# Patient Record
Sex: Female | Born: 1960 | Race: White | Hispanic: No | Marital: Married | Smoking: Never smoker
Health system: Southern US, Community
[De-identification: ages and names within clinical notes are randomized; demographics above are authoritative.]

## PROBLEM LIST (undated history)

## (undated) DIAGNOSIS — R112 Nausea with vomiting, unspecified: Secondary | ICD-10-CM

## (undated) DIAGNOSIS — G4730 Sleep apnea, unspecified: Secondary | ICD-10-CM

## (undated) DIAGNOSIS — I10 Essential (primary) hypertension: Secondary | ICD-10-CM

## (undated) DIAGNOSIS — T8859XA Other complications of anesthesia, initial encounter: Secondary | ICD-10-CM

## (undated) DIAGNOSIS — Z9889 Other specified postprocedural states: Secondary | ICD-10-CM

## (undated) DIAGNOSIS — M1990 Unspecified osteoarthritis, unspecified site: Secondary | ICD-10-CM

## (undated) DIAGNOSIS — L900 Lichen sclerosus et atrophicus: Secondary | ICD-10-CM

## (undated) DIAGNOSIS — N809 Endometriosis, unspecified: Secondary | ICD-10-CM

## (undated) DIAGNOSIS — K219 Gastro-esophageal reflux disease without esophagitis: Secondary | ICD-10-CM

## (undated) HISTORY — PX: BREAST SURGERY: SHX581

## (undated) HISTORY — PX: DILATION AND CURETTAGE OF UTERUS: SHX78

## (undated) HISTORY — PX: CHOLECYSTECTOMY: SHX55

## (undated) HISTORY — PX: RHINOPLASTY: SUR1284

## (undated) HISTORY — DX: Endometriosis, unspecified: N80.9

## (undated) HISTORY — PX: LAPAROSCOPY: SHX197

## (undated) HISTORY — DX: Unspecified osteoarthritis, unspecified site: M19.90

## (undated) HISTORY — DX: Lichen sclerosus et atrophicus: L90.0

## (undated) HISTORY — PX: HYSTEROSCOPY: SHX211

---

## 2003-05-08 ENCOUNTER — Other Ambulatory Visit: Admission: RE | Admit: 2003-05-08 | Discharge: 2003-05-08 | Payer: Self-pay | Admitting: Obstetrics and Gynecology

## 2004-05-08 ENCOUNTER — Other Ambulatory Visit: Admission: RE | Admit: 2004-05-08 | Discharge: 2004-05-08 | Payer: Self-pay | Admitting: Obstetrics and Gynecology

## 2006-01-01 ENCOUNTER — Other Ambulatory Visit: Admission: RE | Admit: 2006-01-01 | Discharge: 2006-01-01 | Payer: Self-pay | Admitting: Obstetrics & Gynecology

## 2007-03-17 ENCOUNTER — Other Ambulatory Visit: Admission: RE | Admit: 2007-03-17 | Discharge: 2007-03-17 | Payer: Self-pay | Admitting: Obstetrics and Gynecology

## 2008-07-28 HISTORY — PX: REDUCTION MAMMAPLASTY: SUR839

## 2008-09-20 ENCOUNTER — Other Ambulatory Visit: Admission: RE | Admit: 2008-09-20 | Discharge: 2008-09-20 | Payer: Self-pay | Admitting: Obstetrics and Gynecology

## 2008-10-03 ENCOUNTER — Ambulatory Visit (HOSPITAL_BASED_OUTPATIENT_CLINIC_OR_DEPARTMENT_OTHER): Admission: RE | Admit: 2008-10-03 | Discharge: 2008-10-03 | Payer: Self-pay | Admitting: Obstetrics and Gynecology

## 2008-10-03 ENCOUNTER — Encounter: Payer: Self-pay | Admitting: Obstetrics and Gynecology

## 2009-03-26 HISTORY — PX: NOVASURE ABLATION: SHX5394

## 2010-07-05 ENCOUNTER — Ambulatory Visit (HOSPITAL_COMMUNITY)
Admission: RE | Admit: 2010-07-05 | Discharge: 2010-07-05 | Payer: Self-pay | Source: Home / Self Care | Attending: Gastroenterology | Admitting: Gastroenterology

## 2010-07-05 ENCOUNTER — Inpatient Hospital Stay (HOSPITAL_COMMUNITY)
Admission: EM | Admit: 2010-07-05 | Discharge: 2010-07-07 | Payer: Self-pay | Source: Home / Self Care | Attending: Surgery | Admitting: Surgery

## 2010-07-05 LAB — PROTIME-INR: INR: 1 (ref 0.00–1.49)

## 2010-07-05 LAB — DIFFERENTIAL
Basophils Relative: 0 % (ref 0–1)
Lymphs Abs: 1.1 10*3/uL (ref 0.7–4.0)
Monocytes Absolute: 0.2 10*3/uL (ref 0.1–1.0)
Monocytes Relative: 3 % (ref 3–12)
Neutro Abs: 6.4 10*3/uL (ref 1.7–7.7)
Neutrophils Relative %: 83 % — ABNORMAL HIGH (ref 43–77)

## 2010-07-05 LAB — BASIC METABOLIC PANEL
BUN: 8 mg/dL (ref 6–23)
CO2: 24 mEq/L (ref 19–32)
Calcium: 9.3 mg/dL (ref 8.4–10.5)
Chloride: 107 mEq/L (ref 96–112)
Creatinine, Ser: 0.78 mg/dL (ref 0.4–1.2)
Glucose, Bld: 116 mg/dL — ABNORMAL HIGH (ref 70–99)

## 2010-07-05 LAB — CBC
Hemoglobin: 14.1 g/dL (ref 12.0–15.0)
MCH: 29.7 pg (ref 26.0–34.0)
MCHC: 35.4 g/dL (ref 30.0–36.0)
MCV: 83.8 fL (ref 78.0–100.0)
RBC: 4.75 MIL/uL (ref 3.87–5.11)

## 2010-07-18 NOTE — Op Note (Signed)
[name], [name]               ACCOUNT NO.:  [id number]  MEDICAL RECORD NO.:  [id number]          PATIENT TYPE:  INP  LOCATION:  1305                         FACILITY:  [facility]  PHYSICIAN:  Thornton Park. Daphine Deutscher, MD  DATE OF BIRTH:  June 13, 1960  DATE OF PROCEDURE:  07/06/2010 DATE OF DISCHARGE:                              OPERATIVE REPORT   PREOPERATIVE DIAGNOSIS:  Acute on chronic cholecystitis and umbilical hernia from prior laparoscopy.  POSTOPERATIVE DIAGNOSIS:  Acute on chronic cholecystitis and umbilical hernia from prior laparoscopy with white bile.  PROCEDURE:  Laparoscopic cholecystectomy with intraoperative cholangiogram and closure of the umbilical hernia.  SURGEON:  Thornton Park. Daphine Deutscher, MD  ASSISTANT:  Sandria Bales. Ezzard Standing, MD  ANESTHESIA:  General endotracheal.  DESCRIPTION OF PROCEDURE:  This 50 year old white female from [place] was taken to OR 11 on Saturday, July 06, 2010, given general anesthesia.  The abdomen was prepped with PCMX and draped sterilely. The patient had a palpable umbilical hernia which we talked about before and that was not use as my means of entrance.  I excised her old transverse scar in the umbilical region and went down and dissected out the fatty hernia.  I went through that, entered the abdomen.  Hasson was placed and 3 trocars were placed in the upper abdomen.  Gallbladder was stuck to the surrounding tissue.  It had whitish hue with a lot of chronic inflammatory changes.  It was elevated and found to be turgid and very distended.  I drained it through the fundus and white bile came out.  I then grasped and elevated and stripped away the adhesions.  I went down and there was a stone impacted in the infundibulum and in that area, I spent time and dissected out Calot's triangle.  Clips were placed upon the artery which went up on to the gallbladder.  I dissected free the cystic duct.  A put a clip upon the gallbladder and incised it. I  did a dynamic cholangiogram which showed the duct to be patent. Intrahepatic radicals were noted and free flow into the duodenum was seen.  Cystic duct was triple clipped and divided.  The gallbladder was then removed from the gallbladder bed with a hook electrocautery without entering it.  Hemostasis was achieved.  No blood and no bile leaks were noted.  The gallbladder was removed and placed in a bag and brought out through the umbilicus.  We then went and looked back at the gallbladder and no bleeding or bile leaks were noted.  The umbilical defect was then visualized from above using again the 30-degree scope which we had used throughout the case. Using Endoclose, three #1 Novafil were placed to obliterate this 1.5-cm defect.  All port sites were injected with 0.5% Marcaine and closed with 4-0 Vicryl.  Benzoin Steri-Strips were used in the skin.  The patient tolerated procedure well and was taken to recovery room in satisfactory condition.     Thornton Park Daphine Deutscher, MD     MBM/MEDQ  D:  07/06/2010  T:  07/06/2010  Job:  [id number]  cc:   Dr. Francia Greaves [facility]  Anselmo Rod, MD, [facility] Fax: [phone]  Electronically Signed by Luretha Murphy MD on 07/18/2010 06:58:06 AM

## 2010-07-18 NOTE — H&P (Signed)
[name], [name]               ACCOUNT NO.:  [id number]  MEDICAL RECORD NO.:  [id number]           PATIENT TYPE:  LOCATION:                                 FACILITY:  PHYSICIAN:  Stephanie Park. Daphine Deutscher, MD  DATE OF BIRTH:  1960/08/19  DATE OF ADMISSION: DATE OF DISCHARGE:                             HISTORY & PHYSICAL   ADMITTING CHIEF COMPLAINT:  Midepigastric pain and gallstone lodged in her cystic duct.  HISTORY:  This is a 50 year old white female who was worked up by Dr. Loreta Ave earlier in the day, presenting with epigastric pain and some reflux.  She has arthritis and takes ibuprofen every other day. Recently, she has had a reasonably good appetite, her weight been stable, but she is just having pain without really nausea or vomiting.  She was sent over and had a gallbladder ultrasound exam, which showed gallstone lodged in the neck of the gallbladder and HIDA scan, which was positive for cystic duct occlusion. MEDICATIONS: 1. Wellbutrin, have one-half pill daily. 2. Ibuprofen for arthritis.  MEDICAL PROBLEMS: 1. Acid reflux. 2. Depression. 3. Joint pains. 4. History of miscarriage. 5. Arthritis.  PAST SURGICAL HISTORY:  C-section, tubal ligation, breast reduction.  FAMILY HISTORY:  Positive.  Her father is alive with hypertension. Mother is alive with bladder cancer.  Brother is alive with hypertension.  There is no family history of colon, breast, or ovarian cancer.  She is not a smoker and she does not drink.  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  She has 3 children, is married, and is accompanied by her husband tonight.  ALLERGIES:  Sulfa drugs that she had when she was a teenager, she had some acne drugs that had contained sulfa.  REVIEW OF SYSTEMS:  15-point review of systems is negative except for acid reflux, occasional rectal bleeding that may be related to the tears, no easy bruisability or bleeding disorders, no chest pain or cardiac history.  No  asthma or breathing difficulties, although she thinks sometimes she is a little short of breath when she exercises because of her weight.  She has had some problems with depression, treated with Wellbutrin.  Otherwise, review of systems is negative.  PHYSICAL EXAMINATION:  GENERAL:  Pleasant white female examined in her room in no acute distress. VITAL SIGNS:  BMI 36.  Temperature afebrile.  Heart rate is 70.  BP 120/80. HEENT:  Unremarkable. NECK:  Supple.  No JVD or lymphadenopathy. CARDIOVASCULAR:  Sinus rhythm without murmurs or gallops. CHEST:  Clear. BREASTS:  She has had breast reduction surgery. ABDOMEN:  Soft without rebound or guarding.  She has a small umbilical type hernia from her previous tubal ligation in the umbilicus. EXTREMITIES:  No cyanosis, edema, or clubbing.  IMPRESSION:  Acute-on-chronic cholecystitis with some residual pain.  PLAN:  Laparoscopic cholecystectomy, probably with intraoperative cholangiogram tomorrow.  I have discussed this procedure with her and her husband and I describedit and its complications, not limited to bile leaks, common bile duct injury, bowel injury, etc.  She is aware of this.  She wants to go ahead and proceed and we will go  ahead, make the necessary arrangements.     Stephanie Park Daphine Deutscher, MD     MBM/MEDQ  D:  07/05/2010  T:  07/05/2010  Job:  [id number]  cc:   Anselmo Rod, MD, [facility] Fax: [phone]  Electronically Signed by Luretha Murphy MD on 07/18/2010 06:58:08 AM

## 2010-09-18 LAB — CBC
HCT: 35.2 % — ABNORMAL LOW (ref 36.0–46.0)
Hemoglobin: 11.6 g/dL — ABNORMAL LOW (ref 12.0–15.0)
MCHC: 33 g/dL (ref 30.0–36.0)
MCV: 77.2 fL — ABNORMAL LOW (ref 78.0–100.0)
RBC: 4.56 MIL/uL (ref 3.87–5.11)
WBC: 4.6 10*3/uL (ref 4.0–10.5)

## 2010-09-18 LAB — HCG, SERUM, QUALITATIVE: Preg, Serum: NEGATIVE

## 2010-10-22 NOTE — Op Note (Signed)
[name], [name]               ACCOUNT NO.:  [id number]   MEDICAL RECORD NO.:  [id number]          PATIENT TYPE:  AMB   LOCATION:  [facility]                         FACILITY:  [facility]   PHYSICIAN:  Cynthia P. Romine, M.D.DATE OF BIRTH:  June 17, 1960   DATE OF PROCEDURE:  10/03/2008  DATE OF DISCHARGE:                               OPERATIVE REPORT   PREOPERATIVE DIAGNOSES:  Menorrhagia, known endometrial polyps seen on  office sonohysterogram.   POSTOPERATIVE DIAGNOSIS:  Menorrhagia, known endometrial polyps seen on  office sonohysterogram.  Path pending.   PROCEDURE:  Hysteroscopic resection of polyps, dilatation and curettage.   ANESTHESIA:  General by LMA.   ESTIMATED BLOOD LOSS:  Minimal.   Sorbitol deficit zero.   COMPLICATIONS:  None.   PROCEDURE:  The patient was taken to the operating room and after  induction of adequate general anesthesia by LMA was placed in dorsal  lithotomy position and prepped and draped in the usual fashion.  The  bladder was drained with a red rubber catheter.  Anterior weighted and  posterior Sims retractors were placed and  the cervix was grasped on its  anterior lip with a single-tooth tenaculum.  Paracervical block was  instituted by injecting 10 mL of 1% lidocaine at each of 3 and 9  o'clock.  The uterus sounded to 12 cm.  The cervix was dilated to #31  Shawnie Pons.  The operative hysteroscope was introduced.  Sorbitol was used as  a distention medium.  The sorbitol pump was set at a pressure of 70  mmHg.   Hysteroscopy revealed there to be two  polyps off the posterior lower  uterine segment.  These were resected with single loop cautery and  photographic documentation was taken both before and after resection.  Upon resecting the polyps,  the remainder of the endometrial cavity  appeared clear and smooth, the tubal ostia were clearly seen.  Scope was  withdrawn.  Gentle sharp curettage was done and sent with the polyps to  Pathology.  The  instruments were removed from the vagina and the  procedure was terminated.   The patient tolerated it well and went in satisfactory condition to post  anesthesia recovery.      Cynthia P. Romine, M.D.  Electronically Signed     CPR/MEDQ  D:  10/03/2008  T:  10/03/2008  Job:  [id number]

## 2011-11-15 ENCOUNTER — Emergency Department (HOSPITAL_COMMUNITY): Payer: 59

## 2011-11-15 ENCOUNTER — Encounter (HOSPITAL_COMMUNITY): Payer: Self-pay | Admitting: Emergency Medicine

## 2011-11-15 ENCOUNTER — Telehealth: Payer: Self-pay | Admitting: Internal Medicine

## 2011-11-15 ENCOUNTER — Emergency Department (HOSPITAL_COMMUNITY)
Admission: EM | Admit: 2011-11-15 | Discharge: 2011-11-15 | Disposition: A | Discharge status: Home / Self Care | Payer: 59 | Attending: Emergency Medicine | Admitting: Emergency Medicine

## 2011-11-15 DIAGNOSIS — N12 Tubulo-interstitial nephritis, not specified as acute or chronic: Secondary | ICD-10-CM | POA: Insufficient documentation

## 2011-11-15 DIAGNOSIS — R1032 Left lower quadrant pain: Secondary | ICD-10-CM | POA: Insufficient documentation

## 2011-11-15 LAB — URINALYSIS, ROUTINE W REFLEX MICROSCOPIC
Bilirubin Urine: NEGATIVE
Glucose, UA: NEGATIVE mg/dL
Ketones, ur: NEGATIVE mg/dL
Nitrite: POSITIVE — AB
Specific Gravity, Urine: 1.018 (ref 1.005–1.030)
pH: 6 (ref 5.0–8.0)

## 2011-11-15 LAB — BASIC METABOLIC PANEL
BUN: 14 mg/dL (ref 6–23)
Chloride: 104 mEq/L (ref 96–112)
Creatinine, Ser: 0.78 mg/dL (ref 0.50–1.10)
Glucose, Bld: 128 mg/dL — ABNORMAL HIGH (ref 70–99)
Potassium: 3.9 mEq/L (ref 3.5–5.1)

## 2011-11-15 LAB — DIFFERENTIAL
Eosinophils Absolute: 0 10*3/uL (ref 0.0–0.7)
Lymphs Abs: 1.3 10*3/uL (ref 0.7–4.0)
Monocytes Absolute: 0.5 10*3/uL (ref 0.1–1.0)
Monocytes Relative: 6 % (ref 3–12)
Neutro Abs: 7.7 10*3/uL (ref 1.7–7.7)
Neutrophils Relative %: 80 % — ABNORMAL HIGH (ref 43–77)

## 2011-11-15 LAB — CBC
HCT: 39.7 % (ref 36.0–46.0)
Hemoglobin: 14 g/dL (ref 12.0–15.0)
MCH: 29.2 pg (ref 26.0–34.0)
RBC: 4.8 MIL/uL (ref 3.87–5.11)

## 2011-11-15 LAB — URINE MICROSCOPIC-ADD ON

## 2011-11-15 MED ORDER — CEFPODOXIME PROXETIL 200 MG PO TABS: 100.0000 mg | ORAL_TABLET | Freq: Two times a day (BID) | ORAL | Status: AC

## 2011-11-15 MED ORDER — HYDROCODONE-ACETAMINOPHEN 5-325 MG PO TABS
1.0000 | ORAL_TABLET | Freq: Once | ORAL | Status: AC
  Administered 2011-11-15: 1 via ORAL
  Filled 2011-11-15: qty 1

## 2011-11-15 MED ORDER — DEXTROSE 5 % IV SOLN
1.0000 g | Freq: Once | INTRAVENOUS | Status: AC
  Administered 2011-11-15: 1 g via INTRAVENOUS
  Filled 2011-11-15: qty 10

## 2011-11-15 MED ORDER — HYDROCODONE-ACETAMINOPHEN 5-325 MG PO TABS: 1.0000 | ORAL_TABLET | ORAL | Status: AC | PRN

## 2011-11-15 MED ORDER — FENTANYL CITRATE 0.05 MG/ML IJ SOLN
50.0000 ug | Freq: Once | INTRAMUSCULAR | Status: AC
  Administered 2011-11-15: 50 ug via INTRAVENOUS
  Filled 2011-11-15: qty 2

## 2011-11-15 NOTE — ED Notes (Signed)
I d/c pt's iv and put railing down and d/c bp cuff and pulse ox.10:09am JG.

## 2011-11-15 NOTE — ED Notes (Signed)
Pt. Stated, I stared having lt. Lower pain all night and i've been having issues with going to the BR having constipation and then diarrhea.

## 2011-11-15 NOTE — ED Provider Notes (Addendum)
History     CSN: [id number]  Arrival date & time 11/15/11  [id number]   First MD Initiated Contact with Patient 11/15/11 [phone]      Chief Complaint  Patient presents with  . Abdominal Pain    (Consider location/radiation/quality/duration/timing/severity/associated sxs/prior treatment) HPI Comments: Patient presents with left lower quadrant abdominal pain that began last night at approximately 10 PM.  Patient notes pain constant and kept her from sleeping last night.  She's had no nausea or vomiting or fevers with this.  No similar symptoms previously.  Patient does note a one-year history of alternating problems of constipation and diarrhea.  She notes that because of straining she does intermittently notice blood in her stools.  Patient also notes some urinary symptoms a few days ago with burning concerning for a UTI.  Patient denies any back pain.  She's had her gallbladder removed but no other abdominal surgeries.  Patient is a 51 y.o. female presenting with abdominal pain. The history is provided by the patient. No language interpreter was used.  Abdominal Pain The primary symptoms of the illness include abdominal pain, diarrhea and dysuria. The primary symptoms of the illness do not include fever, shortness of breath, nausea, vomiting, vaginal discharge or vaginal bleeding. The current episode started yesterday. The onset of the illness was gradual. The problem has been gradually worsening.  Additional symptoms associated with the illness include constipation. Symptoms associated with the illness do not include chills or back pain.    History reviewed. No pertinent past medical history.  Past Surgical History  Procedure Date  . Cholecystectomy     No family history on file.  History  Substance Use Topics  . Smoking status: Never Smoker   . Smokeless tobacco: Not on file  . Alcohol Use: 1.2 oz/week    1 Glasses of wine, 1 Cans of beer per week    OB History    Grav Para Term  Preterm Abortions TAB SAB Ect Mult Living                  Review of Systems  Constitutional: Negative.  Negative for fever and chills.  HENT: Negative.   Eyes: Negative.  Negative for discharge and redness.  Respiratory: Negative.  Negative for cough and shortness of breath.   Cardiovascular: Negative.  Negative for chest pain.  Gastrointestinal: Positive for abdominal pain, diarrhea and constipation. Negative for nausea and vomiting.  Genitourinary: Positive for dysuria. Negative for vaginal bleeding and vaginal discharge.  Musculoskeletal: Negative.  Negative for back pain.  Skin: Negative.  Negative for color change and rash.  Neurological: Negative.  Negative for syncope and headaches.  Hematological: Negative.  Negative for adenopathy.  Psychiatric/Behavioral: Negative.  Negative for confusion.  All other systems reviewed and are negative.    Allergies  Sulfa antibiotics  Home Medications   Current Outpatient Rx  Name Route Sig Dispense Refill  . BUPROPION HCL ER (XL) 150 MG PO TB24 Oral Take 150 mg by mouth daily.    Marland Kitchen PHENAZOPYRIDINE HCL 95 MG PO TABS Oral Take 95 mg by mouth 3 (three) times daily as needed. For burning      BP 139/90  Pulse 94  Temp(Src) 98.4 F (36.9 C) (Oral)  Resp 20  SpO2 100%  Physical Exam  Nursing note and vitals reviewed. Constitutional: She is oriented to person, place, and time. She appears well-developed and well-nourished.  Non-toxic appearance. She does not have a sickly appearance.  HENT:  Head: Normocephalic  and atraumatic.  Eyes: Conjunctivae, EOM and lids are normal. Pupils are equal, round, and reactive to light. No scleral icterus.  Neck: Trachea normal and normal range of motion. Neck supple.  Cardiovascular: Normal rate, regular rhythm and normal heart sounds.   Pulmonary/Chest: Effort normal and breath sounds normal. No respiratory distress. She has no wheezes. She has no rales.  Abdominal: Soft. Normal appearance. There  is tenderness. There is no rebound, no guarding and no CVA tenderness.       Mild left lower quadrant tenderness on exam  Genitourinary:       No CVA tenderness  Musculoskeletal: Normal range of motion.  Neurological: She is alert and oriented to person, place, and time. She has normal strength.  Skin: Skin is warm, dry and intact. No rash noted.  Psychiatric: She has a normal mood and affect. Her behavior is normal. Judgment and thought content normal.    ED Course  Procedures (including critical care time)  Results for orders placed during the hospital encounter of 11/15/11  URINALYSIS, ROUTINE W REFLEX MICROSCOPIC      Component Value Range   Color, Urine YELLOW  YELLOW    APPearance TURBID (*) CLEAR    Specific Gravity, Urine 1.018  1.005 - 1.030    pH 6.0  5.0 - 8.0    Glucose, UA NEGATIVE  NEGATIVE (mg/dL)   Hgb urine dipstick LARGE (*) NEGATIVE    Bilirubin Urine NEGATIVE  NEGATIVE    Ketones, ur NEGATIVE  NEGATIVE (mg/dL)   Protein, ur 161 (*) NEGATIVE (mg/dL)   Urobilinogen, UA 0.2  0.0 - 1.0 (mg/dL)   Nitrite POSITIVE (*) NEGATIVE    Leukocytes, UA LARGE (*) NEGATIVE   CBC      Component Value Range   WBC 9.7  4.0 - 10.5 (K/uL)   RBC 4.80  3.87 - 5.11 (MIL/uL)   Hemoglobin 14.0  12.0 - 15.0 (g/dL)   HCT 09.6  04.5 - 40.9 (%)   MCV 82.7  78.0 - 100.0 (fL)   MCH 29.2  26.0 - 34.0 (pg)   MCHC 35.3  30.0 - 36.0 (g/dL)   RDW 81.1  91.4 - 78.2 (%)   Platelets 197  150 - 400 (K/uL)  DIFFERENTIAL      Component Value Range   Neutrophils Relative 80 (*) 43 - 77 (%)   Neutro Abs 7.7  1.7 - 7.7 (K/uL)   Lymphocytes Relative 14  12 - 46 (%)   Lymphs Abs 1.3  0.7 - 4.0 (K/uL)   Monocytes Relative 6  3 - 12 (%)   Monocytes Absolute 0.5  0.1 - 1.0 (K/uL)   Eosinophils Relative 0  0 - 5 (%)   Eosinophils Absolute 0.0  0.0 - 0.7 (K/uL)   Basophils Relative 0  0 - 1 (%)   Basophils Absolute 0.0  0.0 - 0.1 (K/uL)  BASIC METABOLIC PANEL      Component Value Range   Sodium  138  135 - 145 (mEq/L)   Potassium 3.9  3.5 - 5.1 (mEq/L)   Chloride 104  96 - 112 (mEq/L)   CO2 24  19 - 32 (mEq/L)   Glucose, Bld 128 (*) 70 - 99 (mg/dL)   BUN 14  6 - 23 (mg/dL)   Creatinine, Ser 9.56  0.50 - 1.10 (mg/dL)   Calcium 9.3  8.4 - 21.3 (mg/dL)   GFR calc non Af Amer >90  >90 (mL/min)   GFR calc Af Amer >90  >  90 (mL/min)  URINE MICROSCOPIC-ADD ON      Component Value Range   Squamous Epithelial / LPF FEW (*) RARE    WBC, UA TOO NUMEROUS TO COUNT  <3 (WBC/hpf)   RBC / HPF 11-20  <3 (RBC/hpf)   Bacteria, UA MANY (*) RARE    Dg Abd Acute W/chest  11/15/2011  *RADIOLOGY REPORT*  Clinical Data: Lambert Mody left-sided abdominal pain.  ACUTE ABDOMEN SERIES (ABDOMEN 2 VIEW & CHEST 1 VIEW)  Comparison: Two-view chest 07/05/2010.  Findings: The heart size is normal.  The lungs are clear.  Supine and upright views of the abdomen demonstrate a nonspecific bowel gas pattern.  There is no evidence for obstruction or free air.  No radiopaque stones are present.  The patient is status post cholecystectomy.  Mild rightward curvature of the upper lumbar spine is evident.  IMPRESSION:  1.  No acute abnormality of the chest or abdomen. 2.  Status post cholecystectomy.  Original Report Authenticated By: Jamesetta Orleans. MATTERN, M.D.      MDM  Patient with left lower quadrant pain that may be related to her intermittent constipation for potentially be related to diverticulitis given the location.  I will obtain a CBC and BMP to further evaluate for this.  We'll obtain acute abdominal series to look for signs of constipation.  Patient also noted some dysuria over the last 2 days to check a urinalysis to look for UTI.   Nat Christen, MD 11/15/11 [phone]  Patient with UA that is consistent with urinary tract infection.  Patient will receive a dose of ceftriaxone here.  I plan to send the patient home on Vantin as well.  She is tolerating by mouth intake so I feel she is safe for discharge home.  I  believe her left lower quadrant abdominal pain may be partially related to her intermittent bowel problems but may also be related to her significant urinary tract infection.  I will give the patient precautions to return if she continues to have worsening abdominal pain, fevers, vomiting or other symptoms.  Nat Christen, MD 11/15/11 [phone]

## 2011-11-15 NOTE — Discharge Instructions (Signed)
Pyelonephritis, Adult Pyelonephritis is a kidney infection. In general, there are 2 main types of pyelonephritis:  Infections that come on quickly without any warning (acute pyelonephritis).   Infections that persist for a long period of time (chronic pyelonephritis).  CAUSES  Two main causes of pyelonephritis are:  Bacteria traveling from the bladder to the kidney. This is a problem especially in pregnant women. The urine in the bladder can become filled with bacteria from multiple causes, including:   Inflammation of the prostate gland (prostatitis).   Sexual intercourse in females.   Bladder infection (cystitis).   Bacteria traveling from the bloodstream to the tissue part of the kidney.  Problems that may increase your risk of getting a kidney infection include:  Diabetes.   Kidney stones or bladder stones.   Cancer.   Catheters placed in the bladder.   Other abnormalities of the kidney or ureter.  SYMPTOMS   Abdominal pain.   Pain in the side or flank area.   Fever.   Chills.   Upset stomach.   Blood in the urine (dark urine).   Frequent urination.   Strong or persistent urge to urinate.   Burning or stinging when urinating.  DIAGNOSIS  Your caregiver may diagnose your kidney infection based on your symptoms. A urine sample may also be taken. TREATMENT  In general, treatment depends on how severe the infection is.   If the infection is mild and caught early, your caregiver may treat you with oral antibiotics and send you home.   If the infection is more severe, the bacteria may have gotten into the bloodstream. This will require intravenous (IV) antibiotics and a hospital stay. Symptoms may include:   High fever.   Severe flank pain.   Shaking chills.   Even after a hospital stay, your caregiver may require you to be on oral antibiotics for a period of time.   Other treatments may be required depending upon the cause of the infection.  HOME CARE  INSTRUCTIONS   Take your antibiotics as directed. Finish them even if you start to feel better.   Make an appointment to have your urine checked to make sure the infection is gone.   Drink enough fluids to keep your urine clear or pale yellow.   Take medicines for the bladder if you have urgency and frequency of urination as directed by your caregiver.  SEEK IMMEDIATE MEDICAL CARE IF:   You have a fever.   You are unable to take your antibiotics or fluids.   You develop shaking chills.   You experience extreme weakness or fainting.   There is no improvement after 2 days of treatment.  MAKE SURE YOU:  Understand these instructions.   Will watch your condition.   Will get help right away if you are not doing well or get worse.  Document Released: 05/26/2005 Document Revised: 05/15/2011 Document Reviewed: 10/30/2010 [employer] Patient Information 2012 [employer]. 

## 2011-11-15 NOTE — Telephone Encounter (Signed)
Pt of Dr. Kenna Gilbert LLQ abd pain since last night.  Constant and not improving.  No rectal bleeding or melena.  No n/v at present. I have advised she be seen in urgent care.  She voices understanding. Will make Dr. Loreta Ave aware of her call.

## 2012-05-02 DIAGNOSIS — M549 Dorsalgia, unspecified: Secondary | ICD-10-CM | POA: Insufficient documentation

## 2012-10-14 ENCOUNTER — Ambulatory Visit: Payer: Self-pay | Admitting: Nurse Practitioner

## 2012-10-29 ENCOUNTER — Telehealth: Payer: Self-pay | Admitting: Nurse Practitioner

## 2012-10-29 MED ORDER — BUPROPION HCL ER (XL) 150 MG PO TB24: 150.0000 mg | ORAL_TABLET | Freq: Every day | ORAL | Status: DC

## 2012-10-29 NOTE — Telephone Encounter (Signed)
Pt needs Wellbutrin refill.

## 2012-11-05 ENCOUNTER — Other Ambulatory Visit: Payer: Self-pay | Admitting: Certified Nurse Midwife

## 2012-11-05 NOTE — Telephone Encounter (Signed)
RX sent on 10/29/12, #90 x 3 by Lianne Cure, CMA RX denied.

## 2012-11-09 ENCOUNTER — Encounter: Payer: Self-pay | Admitting: Nurse Practitioner

## 2012-11-09 ENCOUNTER — Encounter: Payer: Self-pay | Admitting: *Deleted

## 2012-11-09 ENCOUNTER — Ambulatory Visit (INDEPENDENT_AMBULATORY_CARE_PROVIDER_SITE_OTHER): Payer: 59 | Admitting: Nurse Practitioner

## 2012-11-09 VITALS — BP 138/70 | HR 76 | Ht 66.0 in | Wt 249.2 lb

## 2012-11-09 DIAGNOSIS — Z01419 Encounter for gynecological examination (general) (routine) without abnormal findings: Secondary | ICD-10-CM

## 2012-11-09 DIAGNOSIS — Z0000 Encounter for general adult medical examination without abnormal findings: Secondary | ICD-10-CM

## 2012-11-09 LAB — POCT URINALYSIS DIPSTICK
Spec Grav, UA: 1.015
Urobilinogen, UA: NEGATIVE

## 2012-11-09 MED ORDER — BUPROPION HCL ER (XL) 150 MG PO TB24: 150.0000 mg | ORAL_TABLET | Freq: Every day | ORAL | Status: DC

## 2012-11-09 NOTE — Progress Notes (Signed)
52 y.o. [id number] Married Caucasian Fe here for annual exam.  No new health diagnosis.  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 Oldest son is getting engaged and wedding next year, she feels motivated to try again to loose weight.  No LMP recorded. Patient has had an ablation.          Sexually active: yes  The current method of family planning is vasectomy.    Exercising: no  The patient does not participate in regular exercise at present. Smoker:  no  Health Maintenance: Pap:  04/17/2010  Normal  MMG:  04/2012 recheck in 6 months right breast 10/07/12 stable Colonoscopy:  Never sees Dr. Loreta Ave will schedule TDaP:  03/17/2007 Labs: Hgb- 14.5   reports that she has never smoked. She has never used smokeless tobacco. She reports that she drinks about 1.2 ounces of alcohol per week. She reports that she does not use illicit drugs.  Past Medical History  Diagnosis Date  . Endometriosis     Past Surgical History  Procedure Laterality Date  . Cholecystectomy    . Hysteroscopy      resection of polyps  . Dilation and curettage of uterus    . Novasure ablation    . Laparoscopy      endometriosis  . Rhinoplasty    . Breast surgery Left     breast Bx/ fat necrosis  . Cesarean section    . Vaginal delivery      x2    Current Outpatient Prescriptions  Medication Sig Dispense Refill  . buPROPion (WELLBUTRIN XL) 150 MG 24 hr tablet Take 1 tablet (150 mg total) by mouth daily.  90 tablet  3   No current facility-administered medications for this visit.    Family History  Problem Relation Age of Onset  . Cancer Mother     bladder and kidney cancer  . Hypertension Father   . Stroke Father   . Kidney disease Brother     ROS:  Pertinent items are noted in HPI.  Otherwise, a comprehensive ROS was negative.  Exam:   BP 138/70  Pulse 76  Ht 5\' 6"  (1.676 m)  Wt 249 lb 3.2 oz (113.036 kg)  BMI 40.24 kg/m2 Height: 5\' 6"  (167.6 cm)  Ht Readings from Last 3 Encounters:  11/09/12 5\' 6"   (1.676 m)    General appearance: alert, cooperative and appears stated age Head: Normocephalic, without obvious abnormality, atraumatic Neck: no adenopathy, supple, symmetrical, trachea midline and thyroid normal to inspection and palpation Lungs: clear to auscultation bilaterally Breasts: normal appearance, no masses or tenderness Heart: regular rate and rhythm Abdomen: soft, non-tender; no masses,  no organomegaly Extremities: extremities normal, atraumatic, no cyanosis or edema Skin: Skin color, texture, turgor normal. No rashes or lesions Lymph nodes: Cervical, supraclavicular, and axillary nodes normal. No abnormal inguinal nodes palpated Neurologic: Grossly normal   Pelvic: External genitalia:  no lesions              Urethra:  normal appearing urethra with no masses, tenderness or lesions              Bartholin's and Skene's: normal                 Vagina: normal appearing vagina with normal color and discharge, no lesions              Cervix: anteverted              Pap taken: yes Bimanual Exam:  Uterus:  normal size, contour, position, consistency, mobility, non-tender              Adnexa: no mass, fullness, tenderness               Rectovaginal: Confirms               Anus:  normal sphincter tone, no lesions  A:  Well Woman with normal exam  S/P Novasure 03/26/09  obesity  P:   Pap smear as per guidelines   Mammogram due 11/14  Refill on Welbutrin XL for 1 year  counseled on breast self exam, adequate intake of calcium and vitamin D, diet and exercise return annually or prn  An After Visit Summary was printed and given to the patient.

## 2012-11-09 NOTE — Patient Instructions (Addendum)

## 2012-11-10 NOTE — Progress Notes (Signed)
Reviewed personally. 

## 2012-11-11 ENCOUNTER — Telehealth: Payer: Self-pay | Admitting: *Deleted

## 2012-11-11 NOTE — Telephone Encounter (Signed)
[facility] mammogram out of hod per Shirlyn Goltz, FNP, see Epic report of mammogram result.

## 2012-11-12 LAB — IPS PAP TEST WITH HPV

## 2013-04-08 ENCOUNTER — Telehealth: Payer: Self-pay | Admitting: Nurse Practitioner

## 2013-04-08 NOTE — Telephone Encounter (Signed)
Please advise & refill med if you feel appropriate. I see that it was only written for 150mg  take 1 po daily. pts chart is in your door

## 2013-04-08 NOTE — Telephone Encounter (Signed)
[facility]  [phone] Patient calling for a refill for Wellbutrin. She takes 300mg s. But she says the RX was written for two 150 mg. In case she wanted to decrease the medicine and to save money. Patient requests refills for 300 mgs. Daily to last until next AEX 11/16/13.

## 2013-04-10 NOTE — Telephone Encounter (Signed)
Per our chart and discussion in the past she was on Wellbutrin 150 mg daily with an extra dose only prn. That's why she was given more pills per month.  If she feels the need to be on a higher dose then I suggest she see her PCP for management.

## 2013-04-13 NOTE — Telephone Encounter (Signed)
Left Message To Call Back and ask for someone in the clinical staff since I will be off tomorrow.

## 2013-04-14 NOTE — Telephone Encounter (Signed)
[name] I spoke with patient. She does not have pcp. She states that she has been been taking two a day since right after her appointment with you in June and has no pcp. She would like to stay on the 300 mg as it is working for her.

## 2013-04-14 NOTE — Telephone Encounter (Signed)
Patient is returning Jasmine's call. Please read prior notes.

## 2013-04-15 MED ORDER — BUPROPION HCL ER (XL) 300 MG PO TB24: ORAL_TABLET | ORAL | Status: DC

## 2013-04-15 NOTE — Telephone Encounter (Signed)
Spoke with patient. New order in.

## 2013-04-22 ENCOUNTER — Telehealth: Payer: Self-pay | Admitting: Nurse Practitioner

## 2013-04-22 NOTE — Telephone Encounter (Signed)
Patient is calling to have a referral sent to [facility] for her 3D mammogram.  Appt is scheduled for 04/28/13

## 2013-04-25 NOTE — Telephone Encounter (Signed)
Spoke with patient. She has appointment scheduled. Will fax order.

## 2013-08-02 ENCOUNTER — Other Ambulatory Visit: Payer: Self-pay | Admitting: Nurse Practitioner

## 2013-08-02 NOTE — Telephone Encounter (Signed)
Last AEX 08/19/2012 Last refill 04/15/2013 #90/0 refills Next appt 11/16/2013  Please advise.

## 2013-11-16 ENCOUNTER — Ambulatory Visit: Payer: 59 | Admitting: Nurse Practitioner

## 2013-11-16 ENCOUNTER — Encounter: Payer: Self-pay | Admitting: Nurse Practitioner

## 2014-03-18 ENCOUNTER — Other Ambulatory Visit: Payer: Self-pay | Admitting: Nurse Practitioner

## 2014-03-20 NOTE — Telephone Encounter (Signed)
LMOM to contact office 

## 2014-03-20 NOTE — Telephone Encounter (Signed)
She must at least schedule AEX prior to us giving a refill until AEX.

## 2014-03-20 NOTE — Telephone Encounter (Signed)
Last AEX: 11/09/12 Last refill:08/02/13 #90 X 1 Current AEX:NS  Please advise

## 2014-03-21 NOTE — Telephone Encounter (Signed)
LMOM to contact office concerning refill request

## 2014-03-23 NOTE — Telephone Encounter (Signed)
LMOM to contact office concerning refill request. I've tried to contact pt 3 times with no response. Will close encounter Routed to provider for final review

## 2014-04-10 ENCOUNTER — Encounter: Payer: Self-pay | Admitting: Nurse Practitioner

## 2014-04-28 ENCOUNTER — Ambulatory Visit (INDEPENDENT_AMBULATORY_CARE_PROVIDER_SITE_OTHER): Payer: 59 | Admitting: Nurse Practitioner

## 2014-04-28 ENCOUNTER — Encounter: Payer: Self-pay | Admitting: Nurse Practitioner

## 2014-04-28 ENCOUNTER — Telehealth: Payer: Self-pay | Admitting: Nurse Practitioner

## 2014-04-28 VITALS — BP 110/66 | HR 72 | Ht 67.0 in | Wt 231.0 lb

## 2014-04-28 DIAGNOSIS — Z9889 Other specified postprocedural states: Secondary | ICD-10-CM

## 2014-04-28 DIAGNOSIS — Z01419 Encounter for gynecological examination (general) (routine) without abnormal findings: Secondary | ICD-10-CM

## 2014-04-28 DIAGNOSIS — Z0000 Encounter for general adult medical examination without abnormal findings: Secondary | ICD-10-CM

## 2014-04-28 LAB — POCT URINALYSIS DIPSTICK
Bilirubin, UA: NEGATIVE
Glucose, UA: NEGATIVE
Ketones, UA: NEGATIVE
Leukocytes, UA: NEGATIVE
Nitrite, UA: NEGATIVE
PH UA: 6
Protein, UA: NEGATIVE
RBC UA: NEGATIVE
UROBILINOGEN UA: NEGATIVE

## 2014-04-28 MED ORDER — BUPROPION HCL ER (XL) 300 MG PO TB24: 300.0000 mg | ORAL_TABLET | Freq: Every day | ORAL | Status: DC

## 2014-04-28 NOTE — Progress Notes (Signed)
Patient ID: [name], female   DOB: [birth year], 53 y.o.   MRN: [id number] 53 y.o. [id number] Married Caucasian Fe here for annual exam.  No new health problems.[place]  Some lower pelvic pain on the right for about 3 months.  Wants to get PUS  Patient's last menstrual period was 03/26/2009.          Sexually active: yes   The current method of family planning is vasectomy.     Exercising: no  The patient does not participate in regular exercise at present. Smoker:  no  Health Maintenance: Pap:  11/09/12, NWL, neg HR HPV  MMG:  04/28/13, Bi-Rads 2: benign findings; scheduled for next week Colonoscopy:  05/18/13, normal, repeat in 10 years BMD:  Never  TDaP:  03/17/2007 Labs:  HB:  Will come back fasting  Urine:  negative   reports that she has never smoked. She has never used smokeless tobacco. She reports that she drinks about 1.2 oz of alcohol per week. She reports that she does not use illicit drugs.  Past Medical History  Diagnosis Date  . Endometriosis     Past Surgical History  Procedure Laterality Date  . Cholecystectomy    . Hysteroscopy      resection of polyps  . Dilation and curettage of uterus    . Novasure ablation    . Laparoscopy      endometriosis  . Rhinoplasty    . Cesarean section    . Vaginal delivery      x2  . Breast surgery Left     breast Bx/ fat necrosis  . Reduction mammaplasty Bilateral 07/28/08    Dr. Chales AbrahamsMary Ann Contaginasis    Current Outpatient Prescriptions  Medication Sig Dispense Refill  . buPROPion (WELLBUTRIN XL) 300 MG 24 hr tablet Take 1 tablet (300 mg total) by mouth daily. 90 tablet 3   No current facility-administered medications for this visit.    Family History  Problem Relation Age of Onset  . Cancer Mother     bladder and kidney cancer  . Hypertension Father   . Kidney disease Brother   . Rheum arthritis Paternal Grandmother     ROS:  Pertinent items are noted in HPI.  Otherwise, a  comprehensive ROS was negative.  Exam:   BP 110/66 mmHg  Pulse 72  Ht 5\' 7"  (1.702 m)  Wt 231 lb (104.781 kg)  BMI 36.17 kg/m2  LMP 03/26/2009 Height: 5\' 7"  (170.2 cm)  Ht Readings from Last 3 Encounters:  04/28/14 5\' 7"  (1.702 m)  11/09/12 5\' 6"  (1.676 m)    General appearance: alert, cooperative and appears stated age Head: Normocephalic, without obvious abnormality, atraumatic Neck: no adenopathy, supple, symmetrical, trachea midline and thyroid normal to inspection and palpation Lungs: clear to auscultation bilaterally Breasts: normal appearance, no masses or tenderness, with bilateral breast reductions Heart: regular rate and rhythm Abdomen: soft, non-tender; no masses,  no organomegaly Extremities: extremities normal, atraumatic, no cyanosis or edema Skin: Skin color, texture, turgor normal. No rashes or lesions Lymph nodes: Cervical, supraclavicular, and axillary nodes normal. No abnormal inguinal nodes palpated Neurologic: Grossly normal   Pelvic: External genitalia:  no lesions              Urethra:  normal appearing urethra with no masses, tenderness or lesions              Bartholin's and Skene's: normal  Vagina: normal appearing vagina with normal color and discharge, no lesions              Cervix: anteverted              Pap taken: Yes.  per request Bimanual Exam:  Uterus:  normal size, contour, position, consistency, mobility, non-tender              Adnexa: no mass, fullness, tenderness only mild in the right adnexa without mass               Rectovaginal: Confirms               Anus:  normal sphincter tone, no lesions  A:  Well Woman with normal exam  S/P Novasure 03/26/09, now with RLQ pelvic discomfort X 3 months Obesity  Situational anxiety - on Wellbutrin and doing well   P:   Reviewed health and wellness pertinent to exam  Pap smear taken today  Mammogram is due 12/15  Will get PUS and follow with  Her concerns of right  adnexal pain.  Will return for fasting labs  Refill Wellbutrin for a year  Counseled on breast self exam, mammography screening, adequate intake of calcium and vitamin D, diet and exercise, Kegel's exercises return annually or prn  An After Visit Summary was printed and given to the patient.

## 2014-04-28 NOTE — Patient Instructions (Signed)

## 2014-04-28 NOTE — Telephone Encounter (Signed)
Patient dnka her aex with [name] 11/16/13. Patient says she did not get a reminder call. Patient says she is very sorry and has never missed an appointment before. Patient is asking is this can please be waived.

## 2014-04-30 NOTE — Progress Notes (Signed)
Encounter reviewed by Dr. Brook Silva.  

## 2014-05-02 NOTE — Telephone Encounter (Signed)
Fee waived as a one time courtesy. Please advise patient only done this one time and will not be able to do in future if happens again.   Thanks,  Sue LushAndrea

## 2014-05-02 NOTE — Telephone Encounter (Signed)
Spoke with patient. Advised that per benefit quote received, she will be responsible for a $25 copay when she comes in for PUS. Patient agreeable. States that she is at an appointment with her daughter, but will call back to schedule.

## 2014-05-03 LAB — IPS PAP TEST WITH HPV

## 2014-05-08 NOTE — Telephone Encounter (Signed)
Left message informing patient of dnka being waived one time only.

## 2014-05-19 ENCOUNTER — Telehealth: Payer: Self-pay | Admitting: Nurse Practitioner

## 2014-05-19 NOTE — Telephone Encounter (Signed)
Call to follow up on scheduling of PUS Pr $25

## 2014-06-06 NOTE — Telephone Encounter (Signed)
Call to follow up on scheduling of PUS  Pr $25 based on 2016 benefit

## 2014-07-12 ENCOUNTER — Telehealth: Payer: Self-pay | Admitting: Nurse Practitioner

## 2014-07-12 NOTE — Telephone Encounter (Signed)
Left message for patient to call back to schedule PUS °

## 2014-09-04 ENCOUNTER — Telehealth: Payer: Self-pay | Admitting: Nurse Practitioner

## 2014-09-04 ENCOUNTER — Other Ambulatory Visit: Payer: Self-pay

## 2014-09-04 DIAGNOSIS — R1031 Right lower quadrant pain: Secondary | ICD-10-CM

## 2014-09-04 DIAGNOSIS — Z9889 Other specified postprocedural states: Secondary | ICD-10-CM

## 2014-09-04 NOTE — Telephone Encounter (Signed)
Call to patient. Advised of benefit quote received for PUS. °Patient agreeable. Scheduled PUS. °Advised patient of 72 hour cancellation policy and $100 cancellation fee. Patient agreeable. °

## 2014-09-04 NOTE — Telephone Encounter (Signed)
Left message for patient to call back  

## 2014-09-04 NOTE — Telephone Encounter (Signed)
-----   Message from [name] sent at 09/04/2014  9:56 AM EDT ----- Contact: [phone] Pt returning call from a week ago regarding ultrasound she thinks.  Thanks!   becky

## 2014-09-11 ENCOUNTER — Telehealth: Payer: Self-pay | Admitting: Obstetrics and Gynecology

## 2014-09-11 NOTE — Telephone Encounter (Signed)
Pt called to cancel/reschedule ultrasound. Pt states she has a scheduling conflict for this Thursday and needs to reschedule. Pt agreeable to return call for scheduling.

## 2014-09-11 NOTE — Telephone Encounter (Signed)
Message left to return call to [name] at [phone].    

## 2014-09-14 ENCOUNTER — Other Ambulatory Visit: Payer: Self-pay

## 2014-09-14 ENCOUNTER — Other Ambulatory Visit: Payer: Self-pay | Admitting: Obstetrics and Gynecology

## 2014-10-03 NOTE — Telephone Encounter (Signed)
Message left to return call to [name] at [phone].    

## 2014-10-17 NOTE — Telephone Encounter (Signed)
Message left to return call to Konor Noren at [phone].    

## 2014-10-18 NOTE — Telephone Encounter (Signed)
Dr. Edward JollySilva,  I have left messages for patient x 3.  She was scheduled for Pelvic ultrasound on 09/14/14 that was ordered by Lauro FranklinPatricia Rolen-Grubb, FNP on office visit 04/28/2014 for RLQ pain and s/p ablation.   Okay to send letter to request patient to return call and cancel order for now?

## 2014-10-18 NOTE — Telephone Encounter (Signed)
Ok to cancel order for pelvic ultrasound.

## 2014-10-19 ENCOUNTER — Encounter: Payer: Self-pay | Admitting: Emergency Medicine

## 2014-10-19 NOTE — Telephone Encounter (Signed)
Letter sent via US mail to request return call back for follow up.  Order will be cancelled . Patient last seen 04/28/2014.  Will close encounter.

## 2014-11-16 ENCOUNTER — Telehealth: Payer: Self-pay | Admitting: Obstetrics and Gynecology

## 2014-11-16 NOTE — Telephone Encounter (Signed)
Spoke with patient. Ultrasound appointment rescheduled with Dr.Silva for 6/30 at 12:30pm with 1pm consult. Patient is agreeable to date and time.  Routing to provider for final review. Patient agreeable to disposition. Will close encounter.

## 2014-11-16 NOTE — Telephone Encounter (Signed)
Pt called to reschedule the canceled 09/14/14 ultrasound appointment.

## 2014-12-06 ENCOUNTER — Telehealth: Payer: Self-pay | Admitting: Obstetrics and Gynecology

## 2014-12-06 NOTE — Telephone Encounter (Signed)
Called patient to review benefits for procedure. Left voicemail to call back and review. Pt copay listed in appt note. Please let patient know this is just an estimate by the insurance company. Should patient have further questions, please transfer to [place].

## 2014-12-07 ENCOUNTER — Ambulatory Visit (INDEPENDENT_AMBULATORY_CARE_PROVIDER_SITE_OTHER): Payer: 59

## 2014-12-07 ENCOUNTER — Encounter: Payer: Self-pay | Admitting: Obstetrics and Gynecology

## 2014-12-07 ENCOUNTER — Ambulatory Visit (INDEPENDENT_AMBULATORY_CARE_PROVIDER_SITE_OTHER): Payer: 59 | Admitting: Obstetrics and Gynecology

## 2014-12-07 VITALS — BP 102/70 | HR 72 | Resp 20 | Ht 67.0 in | Wt 222.0 lb

## 2014-12-07 DIAGNOSIS — M25559 Pain in unspecified hip: Secondary | ICD-10-CM | POA: Diagnosis not present

## 2014-12-07 DIAGNOSIS — K5900 Constipation, unspecified: Secondary | ICD-10-CM | POA: Diagnosis not present

## 2014-12-07 DIAGNOSIS — Z9889 Other specified postprocedural states: Secondary | ICD-10-CM | POA: Diagnosis not present

## 2014-12-07 DIAGNOSIS — R1031 Right lower quadrant pain: Secondary | ICD-10-CM

## 2014-12-07 NOTE — Progress Notes (Signed)
Subjective  54 y.o. [id number]  [name] here for pelvic ultrasound for RLQ pain for several months.  LMP was prior to ablation 03/26/09.  Has intermittent right or left lower abdominal pain.  Pain is shooting pain and short lived.  Reminds her of ovulation pain.   Some constipation and diarrhea. Seems to be one or the other.  This pattern developed after cholecystectomy.   Some hot flashes and night sweats.   Status post endometrial ablation, Cesarean Section, cholecystectomy, and laparoscopy for endometriosis in her 7220s.  Mother deceased from ureteral cancer. Patient saw urologist in past for evaluation and had a normal evaluation.  Objective  Pelvic ultrasound images and report reviewed with patient.  Uterus - no masses. EMS - 1.54 mm. Ovaries - no masses. Free fluid - no        Assessment   Pelvic pain.  Normal pelvic ultrasound. Ovaries look menopausal. Hx of multiple abdominal and pelvic surgeries.  Possible scar tissue.   Hx of constipation and diarrhea.   Plan  Reassurance given regarding anatomy.  Offered laparoscopy but pain is not that significant at this time.  Will try probiotics.  Return for annual exam in 5 months with [name]  Return for increasing pain, nausea and vomiting, or postmenopausal bleeding.  ____15___ minutes face to face time of which over 50% was spent in counseling.   After visit summary to patient.

## 2015-05-07 ENCOUNTER — Ambulatory Visit (INDEPENDENT_AMBULATORY_CARE_PROVIDER_SITE_OTHER): Payer: 59 | Admitting: Nurse Practitioner

## 2015-05-07 ENCOUNTER — Encounter: Payer: Self-pay | Admitting: Nurse Practitioner

## 2015-05-07 VITALS — BP 124/82 | HR 68 | Ht 66.25 in | Wt 232.0 lb

## 2015-05-07 DIAGNOSIS — Z01419 Encounter for gynecological examination (general) (routine) without abnormal findings: Secondary | ICD-10-CM | POA: Diagnosis not present

## 2015-05-07 DIAGNOSIS — Z0000 Encounter for general adult medical examination without abnormal findings: Secondary | ICD-10-CM | POA: Diagnosis not present

## 2015-05-07 NOTE — Patient Instructions (Signed)

## 2015-05-07 NOTE — Progress Notes (Signed)
Patient ID: Elby Showersenise S Episcopo, female   DOB: 09-17-1960, 54 y.o.   MRN: [id number] 54 y.o. [id number] Married  Caucasian Fe here for annual exam.  No new health problems.  She was here in June for some complaints of lower pelvic pain/ pressure and PUS was all normal.  She has not seemed to have the same pain since.  Thinks it may have been related to constipation.  No vaso symptoms.  Patient's last menstrual period was 03/26/2009.    Post Ablation no menses      Sexually active: Yes.    The current method of family planning is vasectomy.    Exercising: No.  The patient does not participate in regular exercise at present. Smoker:  no  Health Maintenance: Pap: 04/28/14, Negative with neg HR HPV  MMG: 05/09/14, Bi-Rads 2: benign findings - apt on 05/11/15 Colonoscopy:  05/18/13, normal, repeat in 10 years TDaP:  03/17/2007 Labs: will return for fasting labs  Urine:     reports that she has never smoked. She has never used smokeless tobacco. She reports that she drinks about 1.2 oz of alcohol per week. She reports that she does not use illicit drugs.  Past Medical History  Diagnosis Date  . Endometriosis     Past Surgical History  Procedure Laterality Date  . Cholecystectomy    . Hysteroscopy      resection of polyps  . Dilation and curettage of uterus    . Novasure ablation    . Laparoscopy      endometriosis  . Rhinoplasty    . Cesarean section    . Vaginal delivery      x2  . Breast surgery Left     breast Bx/ fat necrosis  . Reduction mammaplasty Bilateral 07/28/08    Dr. Chales AbrahamsMary Ann Contaginasis    Current Outpatient Prescriptions  Medication Sig Dispense Refill  . buPROPion (WELLBUTRIN XL) 300 MG 24 hr tablet Take 1 tablet (300 mg total) by mouth daily. 90 tablet 3   No current facility-administered medications for this visit.    Family History  Problem Relation Age of Onset  . Cancer Mother     bladder and kidney cancer  . Hypertension Father   . Kidney disease Brother    . Rheum arthritis Paternal Grandmother     ROS:  Pertinent items are noted in HPI.  Otherwise, a comprehensive ROS was negative.  Exam:   BP 124/82 mmHg  Pulse 68  Ht 5' 6.25" (1.683 m)  Wt 232 lb (105.235 kg)  BMI 37.15 kg/m2  LMP 03/26/2009 Height: 5' 6.25" (168.3 cm) Ht Readings from Last 3 Encounters:  05/07/15 5' 6.25" (1.683 m)  12/07/14 5\' 7"  (1.702 m)  04/28/14 5\' 7"  (1.702 m)    General appearance: alert, cooperative and appears stated age Head: Normocephalic, without obvious abnormality, atraumatic Neck: no adenopathy, supple, symmetrical, trachea midline and thyroid normal to inspection and palpation Lungs: clear to auscultation bilaterally Breasts: normal appearance, no masses or tenderness Heart: regular rate and rhythm Abdomen: soft, non-tender; no masses,  no organomegaly Extremities: extremities normal, atraumatic, no cyanosis or edema Skin: Skin color, texture, turgor normal. No rashes or lesions Lymph nodes: Cervical, supraclavicular, and axillary nodes normal. No abnormal inguinal nodes palpated Neurologic: Grossly normal   Pelvic: External genitalia:  no lesions              Urethra:  normal appearing urethra with no masses, tenderness or lesions  Bartholin's and Skene's: normal                 Vagina: normal appearing vagina with normal color and discharge, no lesions              Cervix: anteverted              Pap taken: No. Bimanual Exam:  Uterus:  normal size, contour, position, consistency, mobility, non-tender              Adnexa: no mass, fullness, tenderness               Rectovaginal: Confirms               Anus:  normal sphincter tone, no lesions  Chaperone present: ye  A:  Well Woman with normal exam  S/P Novasure 03/26/09 Obesity Situational anxiety - on Wellbutrin and doing well   P:   Reviewed health and wellness pertinent to exam  Pap smear as above  Mammogram is due 12/16  Will follow with  labs  Counseled on breast self exam, mammography screening, adequate intake of calcium and vitamin D, diet and exercise return annually or prn  An After Visit Summary was printed and given to the patient.

## 2015-05-09 NOTE — Progress Notes (Signed)
Encounter reviewed by Dr. Tandi Hanko Amundson C. Silva.  

## 2015-05-18 ENCOUNTER — Ambulatory Visit: Payer: 59 | Admitting: Nurse Practitioner

## 2015-08-03 ENCOUNTER — Telehealth: Payer: Self-pay | Admitting: Nurse Practitioner

## 2015-08-03 NOTE — Telephone Encounter (Signed)
Routing to Ria Comment, FNP for recommended lab orders.

## 2015-08-03 NOTE — Telephone Encounter (Signed)
Patient was seen by Ms. Patty 05/07/15 patient was supposed to have blood work done. Patient said she kept putting it off. Patient called wanting to come in and have blood work drawn. Patient scheduled for 08/09/15 can we put orders in for her?

## 2015-08-06 ENCOUNTER — Other Ambulatory Visit: Payer: Self-pay | Admitting: Nurse Practitioner

## 2015-08-06 DIAGNOSIS — Z0000 Encounter for general adult medical examination without abnormal findings: Secondary | ICD-10-CM

## 2015-08-06 NOTE — Telephone Encounter (Signed)
Orders are placed 

## 2015-08-09 ENCOUNTER — Other Ambulatory Visit (INDEPENDENT_AMBULATORY_CARE_PROVIDER_SITE_OTHER): Payer: 59

## 2015-08-09 DIAGNOSIS — E559 Vitamin D deficiency, unspecified: Secondary | ICD-10-CM

## 2015-08-09 DIAGNOSIS — Z0000 Encounter for general adult medical examination without abnormal findings: Secondary | ICD-10-CM

## 2015-08-09 LAB — COMPREHENSIVE METABOLIC PANEL
ALBUMIN: 4.5 g/dL (ref 3.6–5.1)
ALT: 29 U/L (ref 6–29)
AST: 19 U/L (ref 10–35)
Alkaline Phosphatase: 66 U/L (ref 33–130)
BILIRUBIN TOTAL: 0.7 mg/dL (ref 0.2–1.2)
BUN: 21 mg/dL (ref 7–25)
CALCIUM: 9.3 mg/dL (ref 8.6–10.4)
CO2: 26 mmol/L (ref 20–31)
Chloride: 105 mmol/L (ref 98–110)
Creat: 0.78 mg/dL (ref 0.50–1.05)
GLUCOSE: 94 mg/dL (ref 65–99)
POTASSIUM: 4.6 mmol/L (ref 3.5–5.3)
Sodium: 139 mmol/L (ref 135–146)
Total Protein: 6.6 g/dL (ref 6.1–8.1)

## 2015-08-09 LAB — HIV ANTIBODY (ROUTINE TESTING W REFLEX): HIV: NONREACTIVE

## 2015-08-09 LAB — LIPID PANEL
CHOL/HDL RATIO: 5.4 ratio — AB (ref <=5.0)
Cholesterol: 198 mg/dL (ref 125–200)
HDL: 37 mg/dL — AB (ref >=46)
LDL CALC: 140 mg/dL — AB (ref <130)
Triglycerides: 104 mg/dL (ref <150)
VLDL: 21 mg/dL (ref <30)

## 2015-08-09 LAB — TSH: TSH: 4.15 mIU/L

## 2015-08-09 LAB — HEPATITIS C ANTIBODY: HCV AB: NEGATIVE

## 2015-08-10 LAB — VITAMIN D 25 HYDROXY (VIT D DEFICIENCY, FRACTURES): Vit D, 25-Hydroxy: 18 ng/mL — ABNORMAL LOW (ref 30–100)

## 2015-08-14 ENCOUNTER — Telehealth: Payer: Self-pay | Admitting: *Deleted

## 2015-08-14 NOTE — Telephone Encounter (Signed)
I have attempted to contact this patient by phone with the following results: left message to return call to HelenaStephanie at [phone] on answering machine (mobile [facility]).  Name verified in message-advised call was regarding labs.  [phone] (Mobile) *Preferred*

## 2015-08-14 NOTE — Telephone Encounter (Signed)
-----   Message from Ria CommentPatricia Grubb, FNP sent at 08/10/2015  8:08 AM EST ----- Please let pt know that Vit D is very low at 18 - follow vit d protocol.  The Hep C and HIV were negative as expected. The lipid panel is normal except for elevated LDL  CMP is normal.  TSH is normal.

## 2015-08-21 MED ORDER — VITAMIN D (ERGOCALCIFEROL) 1.25 MG (50000 UNIT) PO CAPS: 50000.0000 [IU] | ORAL_CAPSULE | ORAL | Status: DC

## 2015-08-21 NOTE — Addendum Note (Signed)
Addended by: Luisa DagoPHILLIPS, Arran Fessel C on: 08/21/2015 09:07 AM   Modules accepted: Orders

## 2015-08-21 NOTE — Telephone Encounter (Signed)
Pt notified in result note.  Closing encounter. 

## 2015-09-04 ENCOUNTER — Other Ambulatory Visit: Payer: Self-pay | Admitting: Nurse Practitioner

## 2015-09-04 NOTE — Telephone Encounter (Signed)
Patient called and requested refills on Wellbutrin be sent to her pharmacy on file. She said, "I forgot to request this at my last annual exam."

## 2015-09-04 NOTE — Telephone Encounter (Signed)
Medication refill request: Wellbutrin   Last AEX: 05-07-15 Next AEX: 05-14-16 Last MMG (if hormonal medication request): 05-11-15  WNL   Refill authorized: please advise   Sending to SM since PG is out of the office

## 2015-09-05 MED ORDER — BUPROPION HCL ER (XL) 300 MG PO TB24
300.0000 mg | ORAL_TABLET | Freq: Every day | ORAL | Status: DC
Start: 2015-09-05 — End: 2016-05-14

## 2015-11-28 ENCOUNTER — Telehealth: Payer: Self-pay | Admitting: *Deleted

## 2015-11-28 NOTE — Telephone Encounter (Signed)
Left voicemail to call back to schedule lab appt

## 2015-11-28 NOTE — Telephone Encounter (Signed)
-----   Message from Ria CommentPatricia Grubb, FNP sent at 11/27/2015  3:53 PM EDT ----- Please call pt to schedule lab - Vit D recheck  ----- Message -----    From: SYSTEM    Sent: 11/27/2015  12:05 AM      To: Ria CommentPatricia Grubb, FNP

## 2015-12-05 NOTE — Telephone Encounter (Signed)
Left voicemail to call back to schedule lab appt for Vit D recheck.  Second attempt.

## 2015-12-06 NOTE — Telephone Encounter (Signed)
Letter sent to patient. Encounter closed. 

## 2016-03-06 DIAGNOSIS — M1711 Unilateral primary osteoarthritis, right knee: Secondary | ICD-10-CM | POA: Insufficient documentation

## 2016-05-09 DIAGNOSIS — R7309 Other abnormal glucose: Secondary | ICD-10-CM | POA: Insufficient documentation

## 2016-05-09 DIAGNOSIS — E669 Obesity, unspecified: Secondary | ICD-10-CM | POA: Insufficient documentation

## 2016-05-14 ENCOUNTER — Encounter: Payer: Self-pay | Admitting: Nurse Practitioner

## 2016-05-14 ENCOUNTER — Ambulatory Visit (INDEPENDENT_AMBULATORY_CARE_PROVIDER_SITE_OTHER): Payer: 59 | Admitting: Nurse Practitioner

## 2016-05-14 VITALS — BP 116/80 | HR 64 | Ht 66.75 in | Wt 232.0 lb

## 2016-05-14 DIAGNOSIS — Z01419 Encounter for gynecological examination (general) (routine) without abnormal findings: Secondary | ICD-10-CM

## 2016-05-14 DIAGNOSIS — N760 Acute vaginitis: Secondary | ICD-10-CM | POA: Diagnosis not present

## 2016-05-14 DIAGNOSIS — Z0000 Encounter for general adult medical examination without abnormal findings: Secondary | ICD-10-CM

## 2016-05-14 DIAGNOSIS — N9069 Other specified hypertrophy of vulva: Secondary | ICD-10-CM

## 2016-05-14 MED ORDER — FLUCONAZOLE 150 MG PO TABS: 150.0000 mg | ORAL_TABLET | Freq: Once | ORAL | 0 refills | Status: AC

## 2016-05-14 MED ORDER — BUPROPION HCL ER (XL) 300 MG PO TB24: 300.0000 mg | ORAL_TABLET | Freq: Every day | ORAL | 4 refills | Status: DC

## 2016-05-14 MED ORDER — CLOBETASOL PROPIONATE 0.05 % EX OINT: 1.0000 "application " | TOPICAL_OINTMENT | Freq: Two times a day (BID) | CUTANEOUS | 0 refills | Status: DC

## 2016-05-14 NOTE — Patient Instructions (Addendum)
EXERCISE AND DIET:  We recommended that you start or continue a regular exercise program for good health. Regular exercise means any activity that makes your heart beat faster and makes you sweat.  We recommend exercising at least 30 minutes per day at least 3 days a week, preferably 4 or 5.  We also recommend a diet low in fat and sugar.  Inactivity, poor dietary choices and obesity can cause diabetes, heart attack, stroke, and kidney damage, among others.    ALCOHOL AND SMOKING:  Women should limit their alcohol intake to no more than 7 drinks/beers/glasses of wine (combined, not each!) per week. Moderation of alcohol intake to this level decreases your risk of breast cancer and liver damage. And of course, no recreational drugs are part of a healthy lifestyle.  And absolutely no smoking or even second hand smoke. Most people know smoking can cause heart and lung diseases, but did you know it also contributes to weakening of your bones? Aging of your skin?  Yellowing of your teeth and nails?  CALCIUM AND VITAMIN D:  Adequate intake of calcium and Vitamin D are recommended.  The recommendations for exact amounts of these supplements seem to change often, but generally speaking 600 mg of calcium (either carbonate or citrate) and 800 units of Vitamin D per day seems prudent. Certain women may benefit from higher intake of Vitamin D.  If you are among these women, your doctor will have told you during your visit.    PAP SMEARS:  Pap smears, to check for cervical cancer or precancers,  have traditionally been done yearly, although recent scientific advances have shown that most women can have pap smears less often.  However, every woman still should have a physical exam from her gynecologist every year. It will include a breast check, inspection of the vulva and vagina to check for abnormal growths or skin changes, a visual exam of the cervix, and then an exam to evaluate the size and shape of the uterus and  ovaries.  And after 55 years of age, a rectal exam is indicated to check for rectal cancers. We will also provide age appropriate advice regarding health maintenance, like when you should have certain vaccines, screening for sexually transmitted diseases, bone density testing, colonoscopy, mammograms, etc.   MAMMOGRAMS:  All women over 40 years old should have a yearly mammogram. Many facilities now offer a "3D" mammogram, which may cost around $50 extra out of pocket. If possible,  we recommend you accept the option to have the 3D mammogram performed.  It both reduces the number of women who will be called back for extra views which then turn out to be normal, and it is better than the routine mammogram at detecting truly abnormal areas.    COLONOSCOPY:  Colonoscopy to screen for colon cancer is recommended for all women at age 50.  We know, you hate the idea of the prep.  We agree, BUT, having colon cancer and not knowing it is worse!!  Colon cancer so often starts as a polyp that can be seen and removed at colonscopy, which can quite literally save your life!  And if your first colonoscopy is normal and you have no family history of colon cancer, most women don't have to have it again for 10 years.  Once every ten years, you can do something that may end up saving your life, right?  We will be happy to help you get it scheduled when you are ready.    Be sure to check your insurance coverage so you understand how much it will cost.  It may be covered as a preventative service at no cost, but you should check your particular policy.    Have new PCP do Vit D and thyroid panel   LSA  - Lichen Sclerosis and Atrophy   Lichen Sclerosus Introduction Lichen sclerosus is a skin problem. It can happen on any part of the body. It happens most often in the anal or genital areas. It can cause itching and discomfort. Treatment can help to control symptoms. This skin problem is not passed from one person to another  (not contagious). The cause is not known. Follow these instructions at home:  Take over-the-counter and prescription medicines only as told by your doctor.  Use creams or ointments as told by your doctor.  Do not scratch the affected areas of skin.  Women should keep the vagina as clean and dry as they can.  Keep all follow-up visits as told by your doctor. This is important. Contact a doctor if:  Your redness, swelling, or pain gets worse.  You have fluid, blood, or pus coming from the area.  You have new patches (lesions) on your skin.  You have a fever.  You have pain during sex. This information is not intended to replace advice given to you by your health care provider. Make sure you discuss any questions you have with your health care provider. Document Released: 05/08/2008 Document Revised: 11/01/2015 Document Reviewed: 08/21/2014  2017 Elsevier

## 2016-05-14 NOTE — Progress Notes (Signed)
Patient ID: [name], female   DOB: 11/17/1960, 55 y.o.   MRN: [id number]  55 y.o. [id number] Married  Caucasian Fe here for annual exam.  Will be having right knee replacement 05/27/16.  Saw Dr. Loreta AveMann for rectal tear and given Diltiazem compounded. Dr. Loreta AveMann saw some white 'patches' and treated with OTC antifungals with little help.  Many nights itching that wakes her up.  Her bottom feels raw most of the time - not sure if the rectal treatment made things worse.  Patient's last menstrual period was 03/26/2009.          Sexually active: Yes.    The current method of family planning is ablation.    Exercising: No.  The patient does not participate in regular exercise at present. Smoker:  no  Health Maintenance: Pap: 04/28/14, Negative with neg HR HPV  MMG: 05/12/16, no report at time of visit Colonoscopy:  05/18/13, normal, repeat in 10 years BMD: Never TDaP:  03/17/2007 Hep C and HIV: 08/09/15 Labs: 05/09/16 in Care Everywhere (pre-op)   reports that she has never smoked. She has never used smokeless tobacco. She reports that she drinks about 1.2 oz of alcohol per week . She reports that she does not use drugs.  Past Medical History:  Diagnosis Date  . Endometriosis     Past Surgical History:  Procedure Laterality Date  . BREAST SURGERY Left    breast Bx/ fat necrosis  . CESAREAN SECTION    . CHOLECYSTECTOMY    . DILATION AND CURETTAGE OF UTERUS    . HYSTEROSCOPY     resection of polyps  . LAPAROSCOPY     endometriosis  . NOVASURE ABLATION    . REDUCTION MAMMAPLASTY Bilateral 07/28/08   Dr. Chales AbrahamsMary Ann Contaginasis  . RHINOPLASTY    . VAGINAL DELIVERY     x2    Current Outpatient Prescriptions  Medication Sig Dispense Refill  . buPROPion (WELLBUTRIN XL) 300 MG 24 hr tablet Take 1 tablet (300 mg total) by mouth daily. 90 tablet 3  . clotrimazole (LOTRIMIN) 1 % cream Apply 1 application topically daily.    Marland Kitchen. diltiazem 2 % GEL Apply 1 application topically daily.    Marland Kitchen.  triamcinolone cream (KENALOG) 0.1 % Apply 1 application topically 2 (two) times daily.    . Vitamin D, Ergocalciferol, (DRISDOL) 50000 units CAPS capsule Take 1 capsule (50,000 Units total) by mouth every 7 (seven) days. 30 capsule 1   No current facility-administered medications for this visit.     Family History  Problem Relation Age of Onset  . Cancer Mother     bladder and kidney cancer  . Hypertension Father   . Kidney disease Brother   . Rheum arthritis Paternal Grandmother     ROS:  Pertinent items are noted in HPI.  Otherwise, a comprehensive ROS was negative.  Exam:   BP 116/80 (BP Location: Right Arm, Patient Position: Sitting, Cuff Size: Large)   Pulse 64   Ht 5' 6.75" (1.695 m)   Wt 232 lb (105.2 kg)   LMP 03/26/2009   BMI 36.61 kg/m  Height: 5' 6.75" (169.5 cm) Ht Readings from Last 3 Encounters:  05/14/16 5' 6.75" (1.695 m)  05/07/15 5' 6.25" (1.683 m)  12/07/14 5\' 7"  (1.702 m)    General appearance: alert, cooperative and appears stated age Head: Normocephalic, without obvious abnormality, atraumatic Neck: no adenopathy, supple, symmetrical, trachea midline and thyroid normal to inspection and palpation Lungs: clear to auscultation bilaterally Breasts:  normal appearance, no masses or tenderness Heart: regular rate and rhythm Abdomen: soft, non-tender; no masses,  no organomegaly Extremities: extremities normal, atraumatic, no cyanosis or edema Skin: Skin color, texture, turgor normal. No rashes or lesions Lymph nodes: Cervical, supraclavicular, and axillary nodes normal. No abnormal inguinal nodes palpated Neurologic: Grossly normal   Pelvic: External genitalia: multiple lesions that look like LSA              Urethra:  normal appearing urethra with no masses, tenderness or lesions              Bartholin's and Skene's: normal                 Vagina: normal appearing vagina with normal color and white discharge, no lesions              Cervix:  anteverted              Pap taken: Yes.   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

## 2016-05-15 LAB — WET PREP BY MOLECULAR PROBE
CANDIDA SPECIES: NEGATIVE
Gardnerella vaginalis: NEGATIVE
TRICHOMONAS VAG: NEGATIVE

## 2016-05-16 NOTE — Progress Notes (Signed)
Encounter reviewed by Dr. Brook Amundson C. Silva.  

## 2016-05-20 LAB — IPS PAP TEST WITH HPV

## 2016-05-21 ENCOUNTER — Ambulatory Visit: Payer: 59 | Admitting: Nurse Practitioner

## 2016-05-22 ENCOUNTER — Encounter: Payer: Self-pay | Admitting: Nurse Practitioner

## 2016-05-26 ENCOUNTER — Telehealth: Payer: Self-pay | Admitting: Nurse Practitioner

## 2016-05-26 NOTE — Telephone Encounter (Signed)
Called patient to review benefit for vulvar biopsy. Patient would like to wait to schedule based on two pieces of information she provided during the call.   Patient was scheduled for knee replacement 05/27/16. Today she received word it is cancelled based on insurance issues. This is upsetting news for her.   She also states she has improved since her visit. States 'everything is cleared up but I'm not sure if the white patches are still there - I haven't looked." She states if you recommend moving forward with biopsy she will proceed, but unsure if it is necessary at this point.  She is agreeable to return call to advise.

## 2016-05-27 NOTE — Telephone Encounter (Signed)
Spoke with patient. Advised of message as seen below from Dr.Silva. Patient is agreeable. Appointment scheduled for 06/06/2016 at 10 am with Dr.Silva. Patient is agreeable to date and time.  Cc: Dr.Silva  Routing to provider for final review. Patient agreeable to disposition. Will close encounter.

## 2016-05-27 NOTE — Telephone Encounter (Signed)
Routing to Patricia Grubb, FNP for review and advise. 

## 2016-05-27 NOTE — Telephone Encounter (Signed)
I advise moving forward with biopsy - if she gets here and all areas have cleared then we will know that piece of information.

## 2016-06-06 ENCOUNTER — Encounter: Payer: Self-pay | Admitting: Obstetrics and Gynecology

## 2016-06-06 ENCOUNTER — Ambulatory Visit (INDEPENDENT_AMBULATORY_CARE_PROVIDER_SITE_OTHER): Payer: 59 | Admitting: Obstetrics and Gynecology

## 2016-06-06 VITALS — BP 140/88 | HR 66 | Ht 66.75 in | Wt 233.8 lb

## 2016-06-06 DIAGNOSIS — N760 Acute vaginitis: Secondary | ICD-10-CM | POA: Diagnosis not present

## 2016-06-06 DIAGNOSIS — K629 Disease of anus and rectum, unspecified: Secondary | ICD-10-CM

## 2016-06-06 DIAGNOSIS — N9089 Other specified noninflammatory disorders of vulva and perineum: Secondary | ICD-10-CM | POA: Diagnosis not present

## 2016-06-06 MED ORDER — NYSTATIN 100000 UNIT/GM EX POWD: Freq: Three times a day (TID) | CUTANEOUS | 2 refills | Status: DC

## 2016-06-06 NOTE — Progress Notes (Signed)
Subjective:     Patient ID: [name], female   DOB: [birth year], [age] y.o.   MRN: [id number]  HPI  Patient here today for vulvar biopsy.  Has white patches on the vulva and itching. Seen by Shirlyn GoltzPatty Grubb on 05/14/16 for routine annual exam and vulvar biopsy recommended.  Used over the counter antifungal treatment which burned a lot. Took Diflucan and using Temovate ointment.  Symptoms are much improved. Gets rash in her skin folds of the thigh.   Also using pads a lot.   Not sexually active since September due to irritation and discomfort.   Did not have knee surgery this week.  Has insurance issues.  Received steroid injection.   Review of Systems  LMP 2010 Contraception:Postmenopausal/Ablation    Objective:   Physical Exam Hypopigmentation of bilateral superior labia minora, and left perianal region.   Vulvar biopsy.  Consent for procedure. Sterile prep of the left perianal region - betadine. Local 1% lidocaine, lot [id number], expiration 02/21. 3 mm punch biopsy used.  Tissue to pathology.  AgNO3 used.  Bandaid placed.  No complications.  Minimal EBL.     Assessment:     Perianal lesion.  Hypopigmentation.  I suspect lichen sclerosus.  Vulvitis.  Recent steroid injections.     Plan:     Follow up biopsy.  Post biopsy instructions given. Discussion of potential lichen sclerosus.  Reviewed possible continued tx with Clobetasol ointment.  Will try to reduce to use twice a week. Nystatin powder to intertriginous areas bid for one week, prn.

## 2016-06-06 NOTE — Patient Instructions (Signed)

## 2016-06-11 LAB — IPS OTHER TISSUE BIOPSY

## 2016-06-12 ENCOUNTER — Other Ambulatory Visit: Payer: Self-pay | Admitting: *Deleted

## 2016-08-22 ENCOUNTER — Other Ambulatory Visit: Payer: Self-pay | Admitting: Nurse Practitioner

## 2016-08-25 NOTE — Telephone Encounter (Signed)
Medication refill request: mobic  Last AEX:  05/14/16 PG Next AEX: none Last MMG (if hormonal medication request): 05/12/16 BIRADS2:Benign  Refill authorized: please advise.

## 2016-08-25 NOTE — Telephone Encounter (Signed)
[facility]  Is she still on and did [facility].

## 2016-10-08 ENCOUNTER — Ambulatory Visit (INDEPENDENT_AMBULATORY_CARE_PROVIDER_SITE_OTHER): Payer: 59 | Admitting: Urology

## 2016-10-08 ENCOUNTER — Encounter: Payer: Self-pay | Admitting: Urology

## 2016-10-08 VITALS — BP 133/83 | HR 86 | Ht 67.0 in | Wt 228.0 lb

## 2016-10-08 DIAGNOSIS — R350 Frequency of micturition: Secondary | ICD-10-CM | POA: Diagnosis not present

## 2016-10-08 DIAGNOSIS — N3941 Urge incontinence: Secondary | ICD-10-CM

## 2016-10-08 DIAGNOSIS — N393 Stress incontinence (female) (male): Secondary | ICD-10-CM | POA: Diagnosis not present

## 2016-10-08 LAB — URINALYSIS, COMPLETE
BILIRUBIN UA: NEGATIVE
Glucose, UA: NEGATIVE
KETONES UA: NEGATIVE
Nitrite, UA: NEGATIVE
Protein, UA: NEGATIVE
RBC UA: NEGATIVE
UUROB: 0.2 mg/dL (ref 0.2–1.0)
pH, UA: 5.5 (ref 5.0–7.5)

## 2016-10-08 LAB — MICROSCOPIC EXAMINATION: RBC, UA: NONE SEEN /hpf (ref 0–?)

## 2016-10-08 LAB — BLADDER SCAN AMB NON-IMAGING

## 2016-10-08 NOTE — Progress Notes (Signed)
10/08/2016 4:58 PM   [name] [birth year] [id number]  Referring provider: No referring provider defined for this encounter.  Chief Complaint  Patient presents with  . Urinary Frequency    New Patient    HPI: 56 yo F who presents today for evaluation of urinary urgency, Frequency and urge incontinence.  She presented over the last 3 months, she's had worsening of her urinary symptoms as above. Approximate once a week, she has the urge to urinate and can't get the path from an time and wets herself. She does not longer pads or diapers but this is quite embarrassing to her. Her symptoms are primarily during the day time. She only gets up once at night to urinate.  She occasionally feels that she doesn't empty her bladder completely.  She does leak when she laugh, coughs, or sneezes but very minimally.  She does not wear pads.  She has tried intravaginal suppositories for her stress incontinence with helps but causes vaginal irritations were stopped using these.  She has a personal history of endometriosis and is status post diagnostic laparoscopy, hysteroscopy, D&C in the past. She's had 2 vaginal deliveries as well as a C-section.  She recently underwent vulvar biopsy which confirms the finding of lichen sclerosis.  She is not diabetic, A1c 5.5.    She is drinking very little water.  She has one cup of coffee in the AM and unsweetened tea throughout the day.    PVR today 0 cc.    She does have a family history of urothelial cancer, died at age 53.  She was a smoker.    Nonsmoker.    PMH: Past Medical History:  Diagnosis Date  . Arthritis   . Endometriosis     Surgical History: Past Surgical History:  Procedure Laterality Date  . BREAST SURGERY Left    breast Bx/ fat necrosis  . CESAREAN SECTION    . CHOLECYSTECTOMY    . DILATION AND CURETTAGE OF UTERUS    . HYSTEROSCOPY     resection of polyps  . LAPAROSCOPY     endometriosis  . NOVASURE ABLATION   03/26/2009  . REDUCTION MAMMAPLASTY Bilateral 07/28/08   Dr. [name] Contaginasis  . RHINOPLASTY    . VAGINAL DELIVERY     x2    Home Medications:  Allergies as of 10/08/2016      Reactions   Sulfa Antibiotics Swelling      Medication List       Accurate as of 10/08/16  4:58 PM. Always use your most recent med list.          buPROPion 300 MG 24 hr tablet Commonly known as:  WELLBUTRIN XL Take 1 tablet (300 mg total) by mouth daily.   meloxicam 15 MG tablet Commonly known as:  MOBIC Take 1 tablet by mouth daily.   triamcinolone cream 0.1 % Commonly known as:  KENALOG Apply 1 application topically 2 (two) times daily.   Vitamin D (Ergocalciferol) 50000 units Caps capsule Commonly known as:  DRISDOL Take 1 capsule (50,000 Units total) by mouth every 7 (seven) days.       Allergies:  Allergies  Allergen Reactions  . Sulfa Antibiotics Swelling    Family History: Family History  Problem Relation Age of Onset  . Cancer Mother     bladder and kidney cancer  . Hypertension Father   . Kidney disease Brother   . Rheum arthritis Paternal Grandmother     Social History:  reports that she has never smoked. She has never used smokeless tobacco. She reports that she drinks about 1.2 oz of alcohol per week . She reports that she does not use drugs.  ROS: UROLOGY Frequent Urination?: Yes Hard to postpone urination?: Yes Burning/pain with urination?: No Get up at night to urinate?: Yes Leakage of urine?: Yes Urine stream starts and stops?: Yes Trouble starting stream?: Yes Do you have to strain to urinate?: No Blood in urine?: No Urinary tract infection?: No Sexually transmitted disease?: No Injury to kidneys or bladder?: No Painful intercourse?: Yes Weak stream?: Yes Currently pregnant?: No Vaginal bleeding?: No Last menstrual period?: ablation  Gastrointestinal Nausea?: No Vomiting?: No Indigestion/heartburn?: No Diarrhea?: Yes Constipation?:  Yes  Constitutional Fever: No Night sweats?: Yes Weight loss?: No Fatigue?: Yes  Skin Skin rash/lesions?: Yes Itching?: No  Eyes Blurred vision?: No Double vision?: No  Ears/Nose/Throat Sore throat?: No Sinus problems?: No  Hematologic/Lymphatic Swollen glands?: No Easy bruising?: No  Cardiovascular Leg swelling?: No Chest pain?: No  Respiratory Cough?: No Shortness of breath?: No  Endocrine Excessive thirst?: No  Musculoskeletal Back pain?: Yes Joint pain?: Yes  Neurological Headaches?: No Dizziness?: No  Psychologic Depression?: No Anxiety?: No  Physical Exam: BP 133/83   Pulse 86   Ht  (1.702 m)   Wt 228 lb (103.4 kg)   BMI 35.71 kg/m   Constitutional:  Alert and oriented, No acute distress. HEENT: [place] AT, moist mucus membranes.  Trachea midline, no masses. Cardiovascular: No clubbing, cyanosis, or edema. Respiratory: Normal respiratory effort, no increased work of breathing. GI: Abdomen is soft, nontender, nondistended, no abdominal masses.  Obese. GU: No CVA tenderness.  Skin: No rashes, bruises or suspicious lesions. Neurologic: Grossly intact, no focal deficits, moving all 4 extremities. Psychiatric: Normal mood and affect.  Laboratory Data: Lab Results  Component Value Date   WBC 9.7 11/15/2011   HGB 14.5 11/09/2012   HCT 39.7 11/15/2011   MCV 82.7 11/15/2011   PLT 197 11/15/2011    Lab Results  Component Value Date   CREATININE 0.78 08/09/2015    Urinalysis Results for orders placed or performed in visit on 10/08/16  Microscopic Examination  Result Value Ref Range   WBC, UA 0-5 0 - 5 /hpf   RBC, UA None seen 0 - 2 /hpf   Epithelial Cells (non renal) 0-10 0 - 10 /hpf   Mucus, UA Present (A) Not Estab.   Bacteria, UA Few (A) None seen/Few  Urinalysis, Complete  Result Value Ref Range   Specific Gravity, UA >1.030 (H) 1.005 - 1.030   pH, UA 5.5 5.0 - 7.5   Color, UA Yellow Yellow   Appearance Ur Clear Clear    Leukocytes, UA Trace (A) Negative   Protein, UA Negative Negative/Trace   Glucose, UA Negative Negative   Ketones, UA Negative Negative   RBC, UA Negative Negative   Bilirubin, UA Negative Negative   Urobilinogen, Ur 0.2 0.2 - 1.0 mg/dL   Nitrite, UA Negative Negative   Microscopic Examination See below:   BLADDER SCAN AMB NON-IMAGING  Result Value Ref Range   Scan Result 0ml     Pertinent Imaging: PVR 0  Assessment & Plan:    1. Urinary frequency Lengthy discussion today about behavioral modification including avoidance of coffee and he is her primary beverage Although she has no microscopic hematuria today or on previous UAs, given her family history of advanced urothelial carcinoma and new worsening urinary urgency, we'll send urine  cytology to rule out underlying pathology Several samples given today including [name] 4 mg daily 2 weeks as well as Mybetriq 25 mg 4 weeks, advised to try the serially Side effects including dry eyes, dry mouth, constipation, and mild hypertension were discussed with the patient She'll call us and let us know if either of these medications is effective - Urinalysis, Complete - BLADDER SCAN AMB NON-IMAGING  2. Urge incontinence As above  3. Stress incontinence, female Mild stress incontinence but seems to be primarily bothered by her urgency and urge incontinence Based on history, suspect mild component of stress-induced urge incontinence   She will try medications given today and call us and let us know if there were effective in addition to behavioral modification, follow-up to be arranged based on her phone call  Vanna Scotland, MD  [facility] [address] [place], [place] [address] [phone]

## 2016-10-13 ENCOUNTER — Other Ambulatory Visit: Payer: Self-pay | Admitting: Urology

## 2016-10-14 ENCOUNTER — Telehealth: Payer: Self-pay

## 2016-10-14 NOTE — Telephone Encounter (Signed)
LMOM

## 2016-10-14 NOTE — Telephone Encounter (Signed)
-----   Message from Vanna ScotlandAshley Brandon, MD sent at 10/13/2016  2:21 PM EDT ----- Please let this patient know that we checked a urine cytology and it was negative. This is great news.  Vanna ScotlandAshley Brandon, MD

## 2016-10-15 NOTE — Telephone Encounter (Signed)
Spoke with pt in reference to cytology results. Pt voiced understanding.  

## 2016-10-24 ENCOUNTER — Telehealth: Payer: Self-pay

## 2016-10-24 NOTE — Telephone Encounter (Signed)
Pt called stating she has completed the samples of [name] and the dry mouth side effect is almost unbearable. Pt stated that she is having trouble swallowing her food. Reinforced with pt to try myrbetriq samples and call back with results. Pt voiced understanding.

## 2016-12-02 ENCOUNTER — Telehealth: Payer: Self-pay | Admitting: Urology

## 2016-12-02 MED ORDER — FESOTERODINE FUMARATE ER 4 MG PO TB24: 4.0000 mg | ORAL_TABLET | Freq: Every day | ORAL | 11 refills | Status: DC

## 2016-12-02 NOTE — Telephone Encounter (Signed)
Pt called and would like a refill on [name] 4 mg, the [name] did not as well for her.  [facility] [address]

## 2016-12-02 NOTE — Telephone Encounter (Signed)
Script sent  

## 2016-12-05 NOTE — Telephone Encounter (Signed)
[name] PA approved: Dates: 11/04/2016-12/04/2018 PA#: [id number] SS Notified patient. Sent fax approval for scanning.

## 2017-01-01 ENCOUNTER — Other Ambulatory Visit: Payer: Self-pay | Admitting: Gastroenterology

## 2017-01-01 DIAGNOSIS — R1310 Dysphagia, unspecified: Secondary | ICD-10-CM

## 2017-01-01 NOTE — Progress Notes (Signed)
Stephanie Tafolla MD 

## 2017-01-09 ENCOUNTER — Ambulatory Visit
Admission: RE | Admit: 2017-01-09 | Discharge: 2017-01-09 | Disposition: A | Discharge status: Home / Self Care | Payer: 59 | Source: Ambulatory Visit | Attending: Gastroenterology | Admitting: Gastroenterology

## 2017-01-09 DIAGNOSIS — R1310 Dysphagia, unspecified: Secondary | ICD-10-CM

## 2017-05-21 ENCOUNTER — Ambulatory Visit: Payer: 59 | Admitting: Certified Nurse Midwife

## 2017-05-25 ENCOUNTER — Other Ambulatory Visit: Payer: Self-pay

## 2017-05-25 NOTE — Telephone Encounter (Signed)
Medication refill request: Wellbutrin  Last AEX:  05/14/16 PG Next AEX: 06/19/17 DL Last MMG (if hormonal medication request): 05/12/16 BIRADS 2 benign Refill authorized: 05/14/16 #90 w/4 refills; today please advise

## 2017-05-26 MED ORDER — BUPROPION HCL ER (XL) 300 MG PO TB24: 300.0000 mg | ORAL_TABLET | Freq: Every day | ORAL | 0 refills | Status: DC

## 2017-06-19 ENCOUNTER — Other Ambulatory Visit (HOSPITAL_COMMUNITY)
Admission: RE | Admit: 2017-06-19 | Discharge: 2017-06-19 | Disposition: A | Discharge status: Home / Self Care | Payer: 59 | Source: Ambulatory Visit | Attending: Obstetrics & Gynecology | Admitting: Obstetrics & Gynecology

## 2017-06-19 ENCOUNTER — Telehealth: Payer: Self-pay | Admitting: Certified Nurse Midwife

## 2017-06-19 ENCOUNTER — Ambulatory Visit (INDEPENDENT_AMBULATORY_CARE_PROVIDER_SITE_OTHER): Payer: 59 | Admitting: Certified Nurse Midwife

## 2017-06-19 ENCOUNTER — Encounter: Payer: Self-pay | Admitting: Certified Nurse Midwife

## 2017-06-19 VITALS — BP 122/82 | HR 68 | Resp 16 | Ht 66.25 in | Wt 233.0 lb

## 2017-06-19 DIAGNOSIS — Z01419 Encounter for gynecological examination (general) (routine) without abnormal findings: Secondary | ICD-10-CM

## 2017-06-19 DIAGNOSIS — L900 Lichen sclerosus et atrophicus: Secondary | ICD-10-CM

## 2017-06-19 DIAGNOSIS — N951 Menopausal and female climacteric states: Secondary | ICD-10-CM | POA: Diagnosis not present

## 2017-06-19 DIAGNOSIS — F418 Other specified anxiety disorders: Secondary | ICD-10-CM

## 2017-06-19 DIAGNOSIS — Z124 Encounter for screening for malignant neoplasm of cervix: Secondary | ICD-10-CM | POA: Insufficient documentation

## 2017-06-19 MED ORDER — CLOBETASOL PROPIONATE 0.05 % EX OINT: 1.0000 "application " | TOPICAL_OINTMENT | Freq: Two times a day (BID) | CUTANEOUS | 2 refills | Status: DC

## 2017-06-19 MED ORDER — BUPROPION HCL ER (XL) 300 MG PO TB24: 300.0000 mg | ORAL_TABLET | Freq: Every day | ORAL | 4 refills | Status: DC

## 2017-06-19 NOTE — Progress Notes (Signed)
57 y.o. [id number] Married  Caucasian Fe here for annual exam. Post menopausal denies vaginal bleeding or vaginal dryness. Sees Urology as needed for [facility]. Wellbutrin working well for depression and anxiety. Desires continuance. Prefers pap smear yearly. Lichen Sclerosis is causing itching, but using Clobetasol as directed. . No other health issues today. Declines labs today. Going for knee replacement soon.  No LMP recorded. Patient has had an ablation.          Sexually active: Yes.    The current method of family planning is vasectomy.    Exercising: No.  exercise Smoker:  no  Health Maintenance: Pap:  04-28-14 neg HPV HR neg, 05-14-16 neg HPV HR neg History of Abnormal Pap: no MMG:  2018 pt to sign release Self Breast exams: no Colonoscopy:  2014 f/u 44yrs BMD:   none TDaP:  01/2017 Shingles: no Pneumonia: no Hep C and HIV: both neg 2017 Labs: if needed   reports that  has never smoked. she has never used smokeless tobacco. She reports that she drinks alcohol. She reports that she does not use drugs.  Past Medical History:  Diagnosis Date  . Arthritis   . Endometriosis     Past Surgical History:  Procedure Laterality Date  . BREAST SURGERY Left    breast Bx/ fat necrosis  . CESAREAN SECTION    . CHOLECYSTECTOMY    . DILATION AND CURETTAGE OF UTERUS    . HYSTEROSCOPY     resection of polyps  . LAPAROSCOPY     endometriosis  . NOVASURE ABLATION  03/26/2009  . REDUCTION MAMMAPLASTY Bilateral 07/28/08   Dr. [name] Contaginasis  . RHINOPLASTY    . VAGINAL DELIVERY     x2    Current Outpatient Medications  Medication Sig Dispense Refill  . buPROPion (WELLBUTRIN XL) 300 MG 24 hr tablet Take 1 tablet (300 mg total) by mouth daily. 90 tablet 0  . clobetasol ointment (TEMOVATE) 0.05 % Apply 1 application topically 2 (two) times daily.    . fesoterodine ([name]) 4 MG TB24 tablet Take 1 tablet (4 mg total) by mouth daily. 30 tablet 11  . meloxicam (MOBIC) 15 MG  tablet Take by mouth.    . triamcinolone cream (KENALOG) 0.1 % Apply 1 application topically 2 (two) times daily.     No current facility-administered medications for this visit.     Family History  Problem Relation Age of Onset  . Cancer Mother        bladder and kidney cancer  . Hypertension Father   . Kidney disease Brother   . Rheum arthritis Paternal Grandmother     ROS:  Pertinent items are noted in HPI.  Otherwise, a comprehensive ROS was negative.  Exam:   BP 122/82   Pulse 68   Resp 16   Ht 5' 6.25" (1.683 m)   Wt 233 lb (105.7 kg)   BMI 37.32 kg/m  Height: 5' 6.25" (168.3 cm) Ht Readings from Last 3 Encounters:  06/19/17 5' 6.25" (1.683 m)  10/08/16 5\' 7"  (1.702 m)  06/06/16 5' 6.75" (1.695 m)    General appearance: alert, cooperative and appears stated age Head: Normocephalic, without obvious abnormality, atraumatic Neck: no adenopathy, supple, symmetrical, trachea midline and thyroid normal to inspection and palpation Lungs: clear to auscultation bilaterally Breasts: normal appearance, no masses or tenderness, No nipple retraction or dimpling, No nipple discharge or bleeding, No axillary or supraclavicular adenopathy Heart: regular rate and rhythm Abdomen: soft, non-tender; no masses,  no organomegaly Extremities: extremities normal, atraumatic, no cyanosis or edema Skin: Skin color, texture, turgor normal. No rashes or lesions Lymph nodes: Cervical, supraclavicular, and axillary nodes normal. No abnormal inguinal nodes palpated Neurologic: Grossly normal   Pelvic: External genitalia:  no lesions              Urethra:  normal appearing urethra with no masses, tenderness or lesions              Bartholin's and Skene's: normal                 Vagina: normal appearing vagina with normal color and discharge, no lesions              Cervix: no cervical motion tenderness, no lesions and normal appearance              Pap taken: Yes.   Bimanual Exam:  Uterus:   normal size, contour, position, consistency, mobility, non-tender              Adnexa: normal adnexa and no mass, fullness, tenderness               Rectovaginal: Confirms               Anus:  normal sphincter tone, no lesions  Chaperone present: yes  A:  Well Woman with normal exam  Menopausal no HRT  Lichen Sclerosis of vulva  Anxiety/Depression with Wellbutrin working well  Vaginal dryness    P:   Reviewed health and wellness pertinent to exam  Aware of need to evaluate if vaginal bleeding  Discussed Lichen management with Clobetasol and with moisture to skin . Also discussed Aveeno Sitz bath for skin comfort. Questions addressed.  Rx Clobetasol Ointment see order with instructions  Discussed risks/benefits/warning signs with use and need to advise  Rx Wellbutrin see order with instructions  Discussed dryness can also cause itching and discussed coconut oil use trial for moisture instructions given.  Wished well for knee surgery  Pap smear: yes  counseled on breast self exam, mammography screening, adequate intake of calcium and vitamin D, diet and exercise  return annually or prn  An After Visit Summary was printed and given to the patient.

## 2017-06-19 NOTE — Telephone Encounter (Signed)
Patient is requesting a call back to schedule her fasting labs. She is getting ready to have knee surgery. Will hold message and call for scheduling at the end of January 2020.

## 2017-06-19 NOTE — Patient Instructions (Signed)

## 2017-06-23 LAB — CYTOLOGY - PAP: DIAGNOSIS: NEGATIVE

## 2017-06-25 DIAGNOSIS — F418 Other specified anxiety disorders: Secondary | ICD-10-CM | POA: Insufficient documentation

## 2017-06-25 DIAGNOSIS — N3281 Overactive bladder: Secondary | ICD-10-CM | POA: Insufficient documentation

## 2017-07-07 NOTE — Telephone Encounter (Signed)
Called and left patient a message for patient to call back to schedule her fasting labs.

## 2017-07-16 NOTE — Telephone Encounter (Signed)
Patient requested another call back after 08/07/17.

## 2017-07-23 ENCOUNTER — Ambulatory Visit: Payer: 59 | Admitting: Physician Assistant

## 2017-08-17 NOTE — Telephone Encounter (Signed)
Called and left patient a message for patient to call back to schedule her fasting labs.

## 2017-09-10 NOTE — Telephone Encounter (Signed)
Patient has not called back to schedule her fasting labs after multiple times reaching out to her. Okay to close encounter or is further follow up needed?

## 2017-09-11 NOTE — Telephone Encounter (Signed)
Close encounter 

## 2017-09-11 NOTE — Telephone Encounter (Signed)
Ok to close

## 2017-10-09 DIAGNOSIS — I10 Essential (primary) hypertension: Secondary | ICD-10-CM | POA: Insufficient documentation

## 2017-12-18 DIAGNOSIS — L409 Psoriasis, unspecified: Secondary | ICD-10-CM | POA: Insufficient documentation

## 2017-12-24 ENCOUNTER — Other Ambulatory Visit: Payer: Self-pay

## 2018-01-13 NOTE — Progress Notes (Signed)
01/14/2018 9:41 AM   [name] [birth year] [id number]  Referring provider: No referring provider defined for this encounter.  Chief Complaint  Patient presents with  . Urinary Urgency    HPI: Patient is a 57 year old [name] with a history of urgency, frequency, mixed incontinence and family history of urothelial carcinoma who presents today for a medication refill.  Background history 57 yo F who presents today for evaluation of urinary urgency, frequency and urge incontinence.  She presented over the last 3 months, she's had worsening of her urinary symptoms as above. Approximate once a week, she has the urge to urinate and can't get the path from an time and wets herself. She does not longer pads or diapers but this is quite embarrassing to her. Her symptoms are primarily during the day time. She only gets up once at night to urinate.  She occasionally feels that she doesn't empty her bladder completely.  She does leak when she laugh, coughs, or sneezes but very minimally.  She does not wear pads.  She has tried intravaginal suppositories for her stress incontinence with helps but causes vaginal irritations were stopped using these.  She has a personal history of endometriosis and is status post diagnostic laparoscopy, hysteroscopy, D&C in the past. She's had 2 vaginal deliveries as well as a C-section.  She recently underwent vulvar biopsy which confirms the finding of lichen sclerosis.  She is not diabetic, A1c 5.5.  She is drinking very little water.  She has one cup of coffee in the AM and unsweetened tea throughout the day.  PVR today 0 cc.   She does have a family history of urothelial cancer, died at age 57.  She was a smoker.   Nonsmoker.    She was given samples of both [name] and [name] one year ago.  She found the [name] helped reach her goals and continued on that medication.    Today, she is experiencing frequency, urgency, dysuria, nocturia, incontinence,  intermittency and a weak urinary stream.  Her PVR is 48 mL.  Her UA today is positive for moderate bacteria.   Patient denies any gross hematuria, dysuria or suprapubic/flank pain.  Patient denies any fevers, chills, nausea or vomiting.   She has ran out of her [name] one week ago.  She has also been noticing that after intercourse, she is starting to have the feelings of an UTI.  She takes an AZO and the symptoms abate.    She is very concerned about an UA in 06/2017 where she noted RBC's.  I reviewed the chart and in Care Everywhere on 06/15/2017 there was an UA 06/25/2017 and the results where 5 RBC's and > 50 bacteria.  She does not recall having any UTI symptoms at that time.    PMH: Past Medical History:  Diagnosis Date  . Arthritis   . Endometriosis   . Lichen sclerosus     Surgical History: Past Surgical History:  Procedure Laterality Date  . BREAST SURGERY Left    breast Bx/ fat necrosis  . CESAREAN SECTION    . CHOLECYSTECTOMY    . DILATION AND CURETTAGE OF UTERUS    . HYSTEROSCOPY     resection of polyps  . LAPAROSCOPY     endometriosis  . NOVASURE ABLATION  03/26/2009  . REDUCTION MAMMAPLASTY Bilateral 07/28/08   Dr. Chales AbrahamsMary Ann Klein  . RHINOPLASTY    . VAGINAL DELIVERY     x2    Home  Medications:  Allergies as of 01/14/2018      Reactions   Sulfa Antibiotics Swelling      Medication List        Accurate as of 01/14/18  9:41 AM. Always use your most recent med list.          buPROPion 300 MG 24 hr tablet Commonly known as:  WELLBUTRIN XL Take 1 tablet (300 mg total) by mouth daily.   fesoterodine 4 MG Tb24 tablet Commonly known as:  [name] Take 1 tablet (4 mg total) by mouth daily.   losartan 50 MG tablet Commonly known as:  COZAAR Take by mouth.   meloxicam 15 MG tablet Commonly known as:  MOBIC Take by mouth.   triamcinolone cream 0.1 % Commonly known as:  KENALOG Apply 1 application topically 2 (two) times daily.       Allergies:    Allergies  Allergen Reactions  . Sulfa Antibiotics Swelling    Family History: Family History  Problem Relation Age of Onset  . Cancer Mother        bladder and kidney cancer  . Hypertension Father   . Kidney disease Brother   . Rheum arthritis Paternal Grandmother     Social History:  reports that she has never smoked. She has never used smokeless tobacco. She reports that she drinks alcohol. She reports that she does not use drugs.  ROS: UROLOGY Frequent Urination?: Yes Hard to postpone urination?: Yes Burning/pain with urination?: Yes Get up at night to urinate?: Yes Leakage of urine?: Yes Urine stream starts and stops?: No Trouble starting stream?: No Do you have to strain to urinate?: No Blood in urine?: No Urinary tract infection?: No Sexually transmitted disease?: No Injury to kidneys or bladder?: No Painful intercourse?: No Weak stream?: Yes Currently pregnant?: No Vaginal bleeding?: No Last menstrual period?: n  Gastrointestinal Nausea?: No Vomiting?: No Indigestion/heartburn?: No Diarrhea?: Yes Constipation?: Yes  Constitutional Fever: No Night sweats?: No Weight loss?: No Fatigue?: Yes  Skin Skin rash/lesions?: No Itching?: No  Eyes Blurred vision?: Yes Double vision?: No  Ears/Nose/Throat Sore throat?: No Sinus problems?: No  Hematologic/Lymphatic Swollen glands?: No Easy bruising?: No  Cardiovascular Leg swelling?: No Chest pain?: No  Respiratory Cough?: No Shortness of breath?: No  Endocrine Excessive thirst?: No  Musculoskeletal Back pain?: Yes Joint pain?: Yes  Neurological Headaches?: No Dizziness?: No  Psychologic Depression?: No Anxiety?: No  Physical Exam: BP (!) 143/91   Pulse 80   Wt 225 lb (102.1 kg)   BMI 36.04 kg/m   Constitutional: Well nourished. Alert and oriented, No acute distress. HEENT: [place] AT, moist mucus membranes. Trachea midline, no masses. Cardiovascular: No clubbing, cyanosis, or  edema. Respiratory: Normal respiratory effort, no increased work of breathing. GI: Abdomen is soft, non tender, non distended, no abdominal masses. Liver and spleen not palpable.  No hernias appreciated.  Stool sample for occult testing is not indicated.   GU: No CVA tenderness.  No bladder fullness or masses.   Skin: No rashes, bruises or suspicious lesions. Lymph: No cervical or inguinal adenopathy. Neurologic: Grossly intact, no focal deficits, moving all 4 extremities. Psychiatric: Normal mood and affect.  Laboratory Data: Lab Results  Component Value Date   WBC 9.7 11/15/2011   HGB 14.5 11/09/2012   HCT 39.7 11/15/2011   MCV 82.7 11/15/2011   PLT 197 11/15/2011    Lab Results  Component Value Date   CREATININE 0.78 08/09/2015    Urinalysis Moderate bacteria.  See  HPI and Epic.   I have reviewed the labs.  Pertinent Imaging: Results for AVALENE, SEALY (MRN [id number]) as of 01/14/2018 09:38  Ref. Range 01/14/2018 09:06  Scan Result Unknown 48 ml    Assessment & Plan:    1. Microscopic hematuria Patient's mother was diagnosed with urothelial carcinoma at age 69 and subsequently passed away at age 20 so understandably the microscopic hematuria is weighing heavily on the patient's mind and it is concerning to me as well I explained that the work-up for the hematuria will consist of a CT urogram and a cystoscopy and she is agreeable to undergo these evaluations   I explained to the patient that a contrast material will be injected into a vein and that in rare instances, an allergic reaction can result and may even life threatening   The patient denies any allergies to contrast, iodine and/or seafood and is not taking metformin Following the imaging study,  I've recommended a cystoscopy. I described how this is performed, typically in an office setting with a flexible cystoscope. We described the risks, benefits, and possible side effects, the most common of which is a minor  amount of blood in the urine and/or burning which usually resolves in 24 to 48 hours.   The patient had the opportunity to ask questions which were answered. Based upon this discussion, the patient is willing to proceed. Therefore, I've ordered: a CT Urogram and cystoscopy.  2. Urgency Patient did not completely reach goal with [name] 4 mg daily so I have given her samples of [name] 8 mg, #28 she will let us know if she finds greater benefit with the 8 mg dose versus the 4 mg dose and we will prescribe accordingly  2. Frequency  3. Mixed incontinence As above  3. Stress incontinence, female Mild stress incontinence but seems to be primarily bothered by her urgency and urge incontinence Based on history, suspect mild component of stress-induced urge incontinence  4. Family history of advance urothelial carcinoma UA today is negative,but UA in 06/2017 was positive or 5 RBC's No reports of gross hematuria Urine cytology sent today    Michiel Cowboy, [facility]  [facility] [address] [place], [place] [address] [phone]

## 2018-01-14 ENCOUNTER — Ambulatory Visit (INDEPENDENT_AMBULATORY_CARE_PROVIDER_SITE_OTHER): Payer: 59 | Admitting: Urology

## 2018-01-14 ENCOUNTER — Encounter: Payer: Self-pay | Admitting: Urology

## 2018-01-14 VITALS — BP 143/91 | HR 80 | Wt 225.0 lb

## 2018-01-14 DIAGNOSIS — R3129 Other microscopic hematuria: Secondary | ICD-10-CM | POA: Diagnosis not present

## 2018-01-14 DIAGNOSIS — R350 Frequency of micturition: Secondary | ICD-10-CM

## 2018-01-14 DIAGNOSIS — N3946 Mixed incontinence: Secondary | ICD-10-CM | POA: Diagnosis not present

## 2018-01-14 DIAGNOSIS — R3915 Urgency of urination: Secondary | ICD-10-CM

## 2018-01-14 DIAGNOSIS — Z8052 Family history of malignant neoplasm of bladder: Secondary | ICD-10-CM

## 2018-01-14 LAB — MICROSCOPIC EXAMINATION: RBC MICROSCOPIC, UA: NONE SEEN /HPF (ref 0–2)

## 2018-01-14 LAB — URINALYSIS, COMPLETE
BILIRUBIN UA: NEGATIVE
Glucose, UA: NEGATIVE
KETONES UA: NEGATIVE
Leukocytes, UA: NEGATIVE
Nitrite, UA: NEGATIVE
PROTEIN UA: NEGATIVE
RBC UA: NEGATIVE
SPEC GRAV UA: 1.015 (ref 1.005–1.030)
Urobilinogen, Ur: 0.2 mg/dL (ref 0.2–1.0)
pH, UA: 5.5 (ref 5.0–7.5)

## 2018-01-14 LAB — BLADDER SCAN AMB NON-IMAGING: Scan Result: 48

## 2018-01-18 ENCOUNTER — Other Ambulatory Visit: Payer: Self-pay | Admitting: Urology

## 2018-01-18 ENCOUNTER — Telehealth: Payer: Self-pay

## 2018-01-18 LAB — CULTURE, URINE COMPREHENSIVE

## 2018-01-18 MED ORDER — NITROFURANTOIN MONOHYD MACRO 100 MG PO CAPS: 100.0000 mg | ORAL_CAPSULE | Freq: Two times a day (BID) | ORAL | 0 refills | Status: DC

## 2018-01-18 NOTE — Telephone Encounter (Signed)
-----   Message from Harle BattiestShannon A McGowan, PA-C sent at 01/18/2018 12:33 PM EDT ----- Please let Mrs. Illingworth know that her urine culture was positive.  She needs to start Macrobid 100 mg bid for seven days.

## 2018-01-18 NOTE — Telephone Encounter (Signed)
Patient notified on vmail, script sent to pharm  

## 2018-03-02 ENCOUNTER — Telehealth: Payer: Self-pay | Admitting: Urology

## 2018-03-02 ENCOUNTER — Other Ambulatory Visit: Payer: 59 | Admitting: Urology

## 2018-03-02 ENCOUNTER — Other Ambulatory Visit: Payer: Self-pay | Admitting: Urology

## 2018-03-02 MED ORDER — FESOTERODINE FUMARATE ER 4 MG PO TB24: 4.0000 mg | ORAL_TABLET | Freq: Every day | ORAL | 11 refills | Status: DC

## 2018-03-02 MED ORDER — FESOTERODINE FUMARATE ER 4 MG PO TB24: 4.0000 mg | ORAL_TABLET | Freq: Every day | ORAL | 3 refills | Status: DC

## 2018-03-02 NOTE — Telephone Encounter (Signed)
Patient wants a script for Gala Murdochtoviaz but she said she could only take the 4mg  the 8mg  made her do dry? [facility] [address] [facility].   Marcelino DusterMichelle

## 2018-03-02 NOTE — Progress Notes (Signed)
Script in for [name] 4 mg daily.

## 2018-03-05 ENCOUNTER — Ambulatory Visit
Admission: RE | Admit: 2018-03-05 | Discharge: 2018-03-05 | Disposition: A | Discharge status: Home / Self Care | Payer: 59 | Source: Ambulatory Visit | Attending: Urology | Admitting: Urology

## 2018-03-05 DIAGNOSIS — R3129 Other microscopic hematuria: Secondary | ICD-10-CM | POA: Diagnosis present

## 2018-03-05 DIAGNOSIS — Z8052 Family history of malignant neoplasm of bladder: Secondary | ICD-10-CM | POA: Diagnosis present

## 2018-03-05 HISTORY — DX: Essential (primary) hypertension: I10

## 2018-03-05 LAB — POCT I-STAT CREATININE: Creatinine, Ser: 0.8 mg/dL (ref 0.44–1.00)

## 2018-03-05 MED ORDER — IOPAMIDOL (ISOVUE-300) INJECTION 61%
125.0000 mL | Freq: Once | INTRAVENOUS | Status: AC | PRN
  Administered 2018-03-05: 125 mL via INTRAVENOUS

## 2018-03-12 ENCOUNTER — Telehealth: Payer: Self-pay | Admitting: Certified Nurse Midwife

## 2018-03-12 DIAGNOSIS — F418 Other specified anxiety disorders: Secondary | ICD-10-CM

## 2018-03-12 NOTE — Telephone Encounter (Signed)
Medication refill request: Wellbutrin Last AEX:  06/19/2017 Next AEX: 07/02/2018 Last MMG (if hormonal medication request): n/a Refill authorized: #[id number], 4 refills

## 2018-03-12 NOTE — Telephone Encounter (Signed)
Spoke to patient and she says she had an old bottle and will [facility] get [facility]illed.

## 2018-03-12 NOTE — Telephone Encounter (Signed)
Patient requesting refill on Wellbutrin. [facility] on [address] in [place].

## 2018-03-12 NOTE — Telephone Encounter (Signed)
Per date of written should not be out of medication, she needs phone call regarding use

## 2018-04-02 ENCOUNTER — Other Ambulatory Visit: Payer: Self-pay | Admitting: Certified Nurse Midwife

## 2018-04-06 ENCOUNTER — Encounter: Payer: Self-pay | Admitting: Urology

## 2018-04-06 ENCOUNTER — Ambulatory Visit (INDEPENDENT_AMBULATORY_CARE_PROVIDER_SITE_OTHER): Payer: 59 | Admitting: Urology

## 2018-04-06 VITALS — BP 132/83 | HR 73 | Ht 67.0 in | Wt 222.0 lb

## 2018-04-06 DIAGNOSIS — N3946 Mixed incontinence: Secondary | ICD-10-CM

## 2018-04-06 DIAGNOSIS — N2889 Other specified disorders of kidney and ureter: Secondary | ICD-10-CM

## 2018-04-06 DIAGNOSIS — R3129 Other microscopic hematuria: Secondary | ICD-10-CM | POA: Diagnosis not present

## 2018-04-06 LAB — URINALYSIS, COMPLETE
Bilirubin, UA: NEGATIVE
Glucose, UA: NEGATIVE
Ketones, UA: NEGATIVE
NITRITE UA: NEGATIVE
PH UA: 5.5 (ref 5.0–7.5)
Protein, UA: NEGATIVE
SPEC GRAV UA: 1.02 (ref 1.005–1.030)
Urobilinogen, Ur: 0.2 mg/dL (ref 0.2–1.0)

## 2018-04-06 NOTE — Progress Notes (Signed)
   04/06/18  CC:  Chief Complaint  Patient presents with  . Cysto    HPI: 56 year old female with personal history of microscopic hematuria, urinary urgency and frequency and a family history of urothelial carcinoma who presents today for cystoscopy to complete her hematuria work-up.  She underwent a CT urogram on 03/05/2018 which was personally reviewed today.  This indicated only a small incidental left ureterocele, otherwise unremarkable without any GU pathology.  Blood pressure 132/83, pulse 73, height 5\' 7"  (1.702 m), weight 222 lb (100.7 kg). NED. A&Ox3.   No respiratory distress   Abd soft, NT, ND Normal external genitalia with patent urethral meatus  Cystoscopy Procedure Note  Patient identification was confirmed, informed consent was obtained, and patient was prepped using Betadine solution.  Lidocaine jelly was administered per urethral meatus.    Procedure: - Flexible cystoscope introduced, without any difficulty.   - Thorough search of the bladder revealed:    normal urethral meatus    normal urothelium    no stones    no ulcers     no tumors    no urethral polyps    no trabeculation  -Left ureterocele appreciated, heaped up with decompression with bolus of urine.  Right UO normal.  Post-Procedure: - Patient tolerated the procedure well  Assessment/ Plan:  1. Microscopic hematuria Status post negative microscopic hematuria work-up including negative cystoscopy today CT urogram unremarkable other than for incidental finding as below - Urinalysis, Complete  2. Ureterocele Incidental left ureterocele Asymptomatic No history of pyelonephritis, obstruction, or stones of the ureterocele No further intervention or follow-up needed  3. Mixed stress and urge urinary incontinence Continue Gala Murdoch She was interested in other alternatives to pharmacotherapy We discussed weight loss as primary intervention as well as behavioral modification and avoidance of  irritating beverages     Return in about 6 months (around 10/06/2018) for [facility] symptoms.  Vanna Scotland, MD

## 2018-04-15 ENCOUNTER — Encounter: Payer: Self-pay | Admitting: Family Medicine

## 2018-04-15 ENCOUNTER — Ambulatory Visit: Payer: 59 | Admitting: Family Medicine

## 2018-04-15 VITALS — BP 144/98 | HR 85 | Ht 67.0 in

## 2018-04-15 DIAGNOSIS — R300 Dysuria: Secondary | ICD-10-CM

## 2018-04-15 LAB — URINALYSIS, COMPLETE
BILIRUBIN UA: NEGATIVE
Glucose, UA: NEGATIVE
Ketones, UA: NEGATIVE
Nitrite, UA: POSITIVE — AB
PH UA: 6 (ref 5.0–7.5)
Specific Gravity, UA: 1.025 (ref 1.005–1.030)
Urobilinogen, Ur: 0.2 mg/dL (ref 0.2–1.0)

## 2018-04-15 LAB — MICROSCOPIC EXAMINATION

## 2018-04-15 MED ORDER — CEPHALEXIN 250 MG PO CAPS: 250.0000 mg | ORAL_CAPSULE | Freq: Three times a day (TID) | ORAL | 0 refills | Status: DC

## 2018-04-15 NOTE — Progress Notes (Signed)
Patient presents today with dysuria, urinary frequency and urinary incontinence. A urine was collected for UA, UCX. Patient states she has not had any Urological surgeries or been on ABX in the last 30 days. Dr. Apolinar Junes reviewed UA and keflex was sent to pharmacy.

## 2018-04-18 LAB — CULTURE, URINE COMPREHENSIVE

## 2018-04-20 ENCOUNTER — Telehealth: Payer: Self-pay

## 2018-04-20 NOTE — Telephone Encounter (Signed)
-----   Message from Vanna ScotlandAshley Brandon, MD sent at 04/19/2018  8:43 AM EST ----- Pansensitive, Keflex is appropriate.  Vanna ScotlandAshley Brandon, MD

## 2018-04-20 NOTE — Telephone Encounter (Signed)
Called pt no answer. LM for pt informing her of the information below.  

## 2018-04-29 ENCOUNTER — Telehealth: Payer: Self-pay | Admitting: Urology

## 2018-04-29 ENCOUNTER — Other Ambulatory Visit: Payer: Self-pay

## 2018-04-29 ENCOUNTER — Other Ambulatory Visit: Payer: 59

## 2018-04-29 ENCOUNTER — Other Ambulatory Visit: Payer: Self-pay | Admitting: Urology

## 2018-04-29 DIAGNOSIS — R3129 Other microscopic hematuria: Secondary | ICD-10-CM

## 2018-04-29 DIAGNOSIS — R350 Frequency of micturition: Secondary | ICD-10-CM

## 2018-04-29 DIAGNOSIS — R300 Dysuria: Secondary | ICD-10-CM

## 2018-04-29 LAB — URINALYSIS, COMPLETE
Bilirubin, UA: NEGATIVE
Ketones, UA: NEGATIVE
NITRITE UA: POSITIVE — AB
PH UA: 5 (ref 5.0–7.5)
Specific Gravity, UA: 1.025 (ref 1.005–1.030)
UUROB: 1 mg/dL (ref 0.2–1.0)

## 2018-04-29 LAB — MICROSCOPIC EXAMINATION

## 2018-04-29 MED ORDER — NITROFURANTOIN MONOHYD MACRO 100 MG PO CAPS: 100.0000 mg | ORAL_CAPSULE | Freq: Two times a day (BID) | ORAL | 0 refills | Status: DC

## 2018-04-29 NOTE — Telephone Encounter (Signed)
Pt called office and stated she is finished with ABX is is no better.  She is dropping off a urine today.

## 2018-04-29 NOTE — Telephone Encounter (Signed)
[name]'s UA is nitrite positive and looks suspicious.  We will send it for culture.  [facility]

## 2018-04-29 NOTE — Telephone Encounter (Signed)
Pt calling inquring about her urine results, states she is in a lot of pain and AZO isn't helping her. Please advise pt at [phone].

## 2018-04-29 NOTE — Progress Notes (Signed)
[facility]

## 2018-04-29 NOTE — Telephone Encounter (Signed)
Patient informed that rx was sent to pharmacy and urine was sent for culture.

## 2018-04-30 NOTE — Telephone Encounter (Signed)
Script sent and culture sent

## 2018-05-04 ENCOUNTER — Telehealth: Payer: Self-pay

## 2018-05-04 LAB — CULTURE, URINE COMPREHENSIVE

## 2018-05-04 MED ORDER — AMOXICILLIN-POT CLAVULANATE 875-125 MG PO TABS: 1.0000 | ORAL_TABLET | Freq: Two times a day (BID) | ORAL | 0 refills | Status: DC

## 2018-05-04 NOTE — Telephone Encounter (Signed)
Left pt mess to call, script sent to pharm

## 2018-05-04 NOTE — Telephone Encounter (Signed)
-----   Message from Harle BattiestShannon A McGowan, PA-C sent at 05/04/2018 12:10 PM EST ----- Please let [name] know that her urine culture is positive for infection.  She needs to start Augmentin 875/125, twice daily x seven days.

## 2018-05-05 NOTE — Telephone Encounter (Signed)
Left pt mess 

## 2018-07-02 ENCOUNTER — Other Ambulatory Visit: Payer: Self-pay

## 2018-07-02 ENCOUNTER — Encounter: Payer: Self-pay | Admitting: Certified Nurse Midwife

## 2018-07-02 ENCOUNTER — Other Ambulatory Visit (HOSPITAL_COMMUNITY)
Admission: RE | Admit: 2018-07-02 | Discharge: 2018-07-02 | Disposition: A | Discharge status: Home / Self Care | Payer: 59 | Source: Ambulatory Visit | Attending: Certified Nurse Midwife | Admitting: Certified Nurse Midwife

## 2018-07-02 ENCOUNTER — Ambulatory Visit (INDEPENDENT_AMBULATORY_CARE_PROVIDER_SITE_OTHER): Payer: 59 | Admitting: Certified Nurse Midwife

## 2018-07-02 VITALS — BP 116/80 | HR 72 | Resp 16 | Ht 66.25 in | Wt 232.0 lb

## 2018-07-02 DIAGNOSIS — Z124 Encounter for screening for malignant neoplasm of cervix: Secondary | ICD-10-CM | POA: Insufficient documentation

## 2018-07-02 DIAGNOSIS — L900 Lichen sclerosus et atrophicus: Secondary | ICD-10-CM | POA: Diagnosis not present

## 2018-07-02 DIAGNOSIS — Z01419 Encounter for gynecological examination (general) (routine) without abnormal findings: Secondary | ICD-10-CM | POA: Diagnosis not present

## 2018-07-02 DIAGNOSIS — Z0000 Encounter for general adult medical examination without abnormal findings: Secondary | ICD-10-CM | POA: Diagnosis not present

## 2018-07-02 LAB — POCT URINALYSIS DIPSTICK
Bilirubin, UA: NEGATIVE
Blood, UA: NEGATIVE
Glucose, UA: NEGATIVE
KETONES UA: NEGATIVE
Nitrite, UA: NEGATIVE
Protein, UA: NEGATIVE
Urobilinogen, UA: NEGATIVE E.U./dL — AB
pH, UA: 5 (ref 5.0–8.0)

## 2018-07-02 NOTE — Progress Notes (Signed)
58 y.o. [id number] Married  Caucasian Fe here for annual exam. Menopausal no HRT. Vaginal dryness so much better with coconut oil use. Depression much better, stopped Wellbutrin feels she does not need now. Treating Lichen with Clobetasol with flares and working well. Had total knee right replacement. Seeing PCP for aex,, hypertension management and [facility]. No other health issues today.  No LMP recorded. Patient has had an ablation.          Sexually active: Yes.    The current method of family planning is vasectomy.    Exercising: No.  exercise Smoker:  no  Review of Systems  Constitutional: Negative.   HENT: Negative.   Eyes: Negative.   Respiratory: Negative.   Cardiovascular: Negative.   Gastrointestinal: Negative.   Genitourinary: Positive for frequency.       Loss of sexual interest  Musculoskeletal: Negative.   Skin: Negative.   Neurological: Negative.   Endo/Heme/Allergies: Negative.   Psychiatric/Behavioral: Negative.     Health Maintenance: Pap:  05-14-16 neg HPV HR neg, 06-19-17 neg History of Abnormal Pap: no MMG:  2019 waiting on fax Self Breast exams: no Colonoscopy:  2014 f/u 5660yrs BMD:   none TDaP:  2018 Shingles: no Pneumonia: no Hep C and HIV: both neg 2017 Labs: poct urine-wbc 1+   reports that she has never smoked. She has never used smokeless tobacco. She reports current alcohol use. She reports that she does not use drugs.  Past Medical History:  Diagnosis Date  . Arthritis   . Endometriosis   . Hypertension   . Lichen sclerosus     Past Surgical History:  Procedure Laterality Date  . BREAST SURGERY Left    breast Bx/ fat necrosis  . CESAREAN SECTION    . CHOLECYSTECTOMY    . DILATION AND CURETTAGE OF UTERUS    . HYSTEROSCOPY     resection of polyps  . LAPAROSCOPY     endometriosis  . NOVASURE ABLATION  03/26/2009  . REDUCTION MAMMAPLASTY Bilateral 07/28/08   Dr. Chales AbrahamsMary Ann Contaginasis  . RHINOPLASTY    . VAGINAL DELIVERY     x2     Current Outpatient Medications  Medication Sig Dispense Refill  . fesoterodine ([name]) 4 MG TB24 tablet Take 1 tablet (4 mg total) by mouth daily. 30 tablet 11  . losartan (COZAAR) 50 MG tablet Take by mouth.    . meloxicam (MOBIC) 15 MG tablet     . omeprazole (PRILOSEC) 40 MG capsule as needed.    . triamcinolone cream (KENALOG) 0.1 % Apply 1 application topically 2 (two) times daily.     No current facility-administered medications for this visit.     Family History  Problem Relation Age of Onset  . Cancer Mother        bladder and kidney cancer  . Hypertension Father   . Kidney disease Brother   . Rheum arthritis Paternal Grandmother     ROS:  Pertinent items are noted in HPI.  Otherwise, a comprehensive ROS was negative.  Exam:   BP 116/80   Pulse 72   Resp 16   Ht 5' 6.25" (1.683 m)   Wt 232 lb (105.2 kg)   BMI 37.16 kg/m  Height: 5' 6.25" (168.3 cm) Ht Readings from Last 3 Encounters:  07/02/18 5' 6.25" (1.683 m)  04/15/18 5\' 7"  (1.702 m)  04/06/18 5\' 7"  (1.702 m)    General appearance: alert, cooperative and appears stated age Head: Normocephalic, without obvious abnormality,  atraumatic Neck: no adenopathy, supple, symmetrical, trachea midline and thyroid normal to inspection and palpation Lungs: clear to auscultation bilaterally Breasts: normal appearance, no masses or tenderness, No nipple retraction or dimpling, No nipple discharge or bleeding, No axillary or supraclavicular adenopathy Heart: regular rate and rhythm Abdomen: soft, non-tender; no masses,  no organomegaly Extremities: extremities normal, atraumatic, no cyanosis or edema Skin: Skin color, texture, turgor normal. No rashes or lesions Lymph nodes: Cervical, supraclavicular, and axillary nodes normal. No abnormal inguinal nodes palpated Neurologic: Grossly normal   Pelvic: External genitalia:  no lesions, normal female, no Lichen Sclerosis flare noted              Urethra:  normal  appearing urethra with no masses, tenderness or lesions              Bartholin's and Skene's: normal                 Vagina: normal appearing vagina with normal color and discharge, no lesions              Cervix: no cervical motion tenderness, no lesions and normal appearance              Pap taken: Yes.   Bimanual Exam:  Uterus:  normal size, contour, position, consistency, mobility, non-tender and anteverted              Adnexa: normal adnexa and no mass, fullness, tenderness               Rectovaginal: Confirms               Anus:  normal sphincter tone, no lesions  Chaperone present: yes  A:  Well Woman with normal exam  Menopausal no HRT  Vaginal dryness coconut oil working well  History of Lichen Sclerosis Clobetasol working well if flare.  PCP management of hypertension  P:   Reviewed health and wellness pertinent to exam  Aware of need to advise if vaginal bleeding  Will advise if issues with dryness.  Has Rx, if not clearing need to check in office with flare or change in symptoms. Avoid excessive use, due to thinning of tissue.  Continue follow up as indicated with MD  Pap smear: yes  counseled on breast self exam, mammography screening, feminine hygiene, adequate intake of calcium and vitamin D, diet and exercise, Kegel's exercises  return annually or prn  An After Visit Summary was printed and given to the patient.

## 2018-07-02 NOTE — Patient Instructions (Signed)

## 2018-07-06 LAB — CYTOLOGY - PAP
Diagnosis: NEGATIVE
HPV: NOT DETECTED

## 2018-07-19 ENCOUNTER — Emergency Department: Payer: 59

## 2018-07-19 ENCOUNTER — Encounter: Payer: Self-pay | Admitting: Intensive Care

## 2018-07-19 ENCOUNTER — Emergency Department
Admission: EM | Admit: 2018-07-19 | Discharge: 2018-07-19 | Disposition: A | Discharge status: Home / Self Care | Payer: 59 | Attending: Emergency Medicine | Admitting: Emergency Medicine

## 2018-07-19 ENCOUNTER — Other Ambulatory Visit: Payer: Self-pay

## 2018-07-19 ENCOUNTER — Ambulatory Visit (INDEPENDENT_AMBULATORY_CARE_PROVIDER_SITE_OTHER): Payer: 59 | Admitting: Family Medicine

## 2018-07-19 VITALS — BP 161/97 | HR 94

## 2018-07-19 DIAGNOSIS — R1030 Lower abdominal pain, unspecified: Secondary | ICD-10-CM | POA: Diagnosis not present

## 2018-07-19 DIAGNOSIS — R8290 Unspecified abnormal findings in urine: Secondary | ICD-10-CM | POA: Diagnosis not present

## 2018-07-19 DIAGNOSIS — R350 Frequency of micturition: Secondary | ICD-10-CM | POA: Insufficient documentation

## 2018-07-19 DIAGNOSIS — R300 Dysuria: Secondary | ICD-10-CM | POA: Diagnosis not present

## 2018-07-19 DIAGNOSIS — N10 Acute tubulo-interstitial nephritis: Secondary | ICD-10-CM | POA: Diagnosis not present

## 2018-07-19 DIAGNOSIS — R509 Fever, unspecified: Secondary | ICD-10-CM | POA: Diagnosis present

## 2018-07-19 DIAGNOSIS — Z79899 Other long term (current) drug therapy: Secondary | ICD-10-CM | POA: Insufficient documentation

## 2018-07-19 DIAGNOSIS — I10 Essential (primary) hypertension: Secondary | ICD-10-CM | POA: Insufficient documentation

## 2018-07-19 LAB — COMPREHENSIVE METABOLIC PANEL
ALT: 48 U/L — ABNORMAL HIGH (ref 0–44)
AST: 25 U/L (ref 15–41)
Albumin: 4.4 g/dL (ref 3.5–5.0)
Alkaline Phosphatase: 68 U/L (ref 38–126)
Anion gap: 8 (ref 5–15)
BUN: 15 mg/dL (ref 6–20)
CO2: 24 mmol/L (ref 22–32)
Calcium: 8.8 mg/dL — ABNORMAL LOW (ref 8.9–10.3)
Chloride: 103 mmol/L (ref 98–111)
Creatinine, Ser: 0.78 mg/dL (ref 0.44–1.00)
GFR calc Af Amer: >60 mL/min (ref >60)
GFR calc non Af Amer: >60 mL/min (ref >60)
Glucose, Bld: 107 mg/dL — ABNORMAL HIGH (ref 70–99)
Potassium: 3.4 mmol/L — ABNORMAL LOW (ref 3.5–5.1)
Sodium: 135 mmol/L (ref 135–145)
Total Bilirubin: 1.2 mg/dL (ref 0.3–1.2)
Total Protein: 6.9 g/dL (ref 6.5–8.1)

## 2018-07-19 LAB — URINALYSIS, COMPLETE (UACMP) WITH MICROSCOPIC
Bilirubin Urine: NEGATIVE
Glucose, UA: NEGATIVE mg/dL
Ketones, ur: NEGATIVE mg/dL
NITRITE: POSITIVE — AB
Protein, ur: 30 mg/dL — AB
SPECIFIC GRAVITY, URINE: 1.017 (ref 1.005–1.030)
Squamous Epithelial / HPF: NONE SEEN (ref 0–5)
WBC, UA: >50 WBC/hpf — ABNORMAL HIGH (ref 0–5)
pH: 6 (ref 5.0–8.0)

## 2018-07-19 LAB — CBC WITH DIFFERENTIAL/PLATELET
ABS IMMATURE GRANULOCYTES: 0.03 10*3/uL (ref 0.00–0.07)
Basophils Absolute: 0 10*3/uL (ref 0.0–0.1)
Basophils Relative: 0 %
Eosinophils Absolute: 0 10*3/uL (ref 0.0–0.5)
Eosinophils Relative: 0 %
HCT: 39.9 % (ref 36.0–46.0)
Hemoglobin: 13.1 g/dL (ref 12.0–15.0)
Immature Granulocytes: 0 %
Lymphocytes Relative: 15 %
Lymphs Abs: 1.3 10*3/uL (ref 0.7–4.0)
MCH: 27.4 pg (ref 26.0–34.0)
MCHC: 32.8 g/dL (ref 30.0–36.0)
MCV: 83.5 fL (ref 80.0–100.0)
Monocytes Absolute: 0.6 10*3/uL (ref 0.1–1.0)
Monocytes Relative: 7 %
NEUTROS ABS: 6.4 10*3/uL (ref 1.7–7.7)
NEUTROS PCT: 78 %
Platelets: 216 10*3/uL (ref 150–400)
RBC: 4.78 MIL/uL (ref 3.87–5.11)
RDW: 13.9 % (ref 11.5–15.5)
WBC: 8.3 10*3/uL (ref 4.0–10.5)
nRBC: 0 % (ref 0.0–0.2)

## 2018-07-19 LAB — LACTIC ACID, PLASMA: Lactic Acid, Venous: 0.7 mmol/L (ref 0.5–1.9)

## 2018-07-19 MED ORDER — AMOXICILLIN-POT CLAVULANATE 875-125 MG PO TABS: 1.0000 | ORAL_TABLET | Freq: Two times a day (BID) | ORAL | 0 refills | Status: DC

## 2018-07-19 MED ORDER — IBUPROFEN 600 MG PO TABS
600.0000 mg | ORAL_TABLET | ORAL | Status: AC
  Administered 2018-07-19: 600 mg via ORAL
  Filled 2018-07-19: qty 1

## 2018-07-19 MED ORDER — SODIUM CHLORIDE 0.9 % IV SOLN
1.0000 g | Freq: Once | INTRAVENOUS | Status: AC
  Administered 2018-07-19: 1 g via INTRAVENOUS
  Filled 2018-07-19: qty 10

## 2018-07-19 MED ORDER — ONDANSETRON 4 MG PO TBDP: 4.0000 mg | ORAL_TABLET | Freq: Four times a day (QID) | ORAL | 0 refills | Status: DC | PRN

## 2018-07-19 NOTE — ED Provider Notes (Signed)
[facility] Emergency Department Provider Note   ____________________________________________   First MD Initiated Contact with Patient 07/19/18 1554     (approximate)  I have reviewed the triage vital signs and the nursing notes.   HISTORY  Chief Complaint Urinary Tract Infection and Fever    HPI [name] is a 58 y.o. female history of hypertension  Patient reports Saturday she had a fever about 103 with shaking chills.  This went away after use of ibuprofen, however today noticed that she feels like she is having a lot of urinary frequency, also a bad urine odor, pain across the very lower abdomen worsened with urination.  She had a fever about 101 as well.  Went to her urologist office, gave a urine sample and they told her it looked like an infection and she needed to start antibiotics and was referred to the emergency room  She denies nausea or vomiting.  No chest pain.  No runny nose or cough.  No shortness of breath.  She has had urinary tract infection before, no history of kidney stones.  Ports the pain radiates up just slightly to the lower back, but primarily located over the bladder.  Denies pregnancy.   Past Medical History:  Diagnosis Date  . Arthritis   . Endometriosis   . Hypertension   . Lichen sclerosus     Patient Active Problem List   Diagnosis Date Noted  . Psoriasis 12/18/2017  . Essential hypertension 10/09/2017  . Depression with anxiety 06/25/2017  . Overactive bladder 06/25/2017  . Elevated glucose 05/09/2016  . Obesity (BMI 30-39.9) 05/09/2016  . Primary osteoarthritis of right knee 03/06/2016  . Back pain 05/02/2012    Past Surgical History:  Procedure Laterality Date  . BREAST SURGERY Left    breast Bx/ fat necrosis  . CESAREAN SECTION    . CHOLECYSTECTOMY    . DILATION AND CURETTAGE OF UTERUS    . HYSTEROSCOPY     resection of polyps  . LAPAROSCOPY     endometriosis  . NOVASURE ABLATION  03/26/2009    . REDUCTION MAMMAPLASTY Bilateral 07/28/08   Dr. [name] Contaginasis  . RHINOPLASTY    . VAGINAL DELIVERY     x2    Prior to Admission medications   Medication Sig Start Date End Date Taking? Authorizing Provider  amoxicillin-clavulanate (AUGMENTIN) 875-125 MG tablet Take 1 tablet by mouth 2 (two) times daily. 07/19/18   Sharyn Creamer, MD  fesoterodine ([name]) 4 MG TB24 tablet Take 1 tablet (4 mg total) by mouth daily. 03/02/18   Michiel Cowboy A, PA-C  losartan (COZAAR) 50 MG tablet Take by mouth. 10/09/17 10/09/18  [provider]  meloxicam (MOBIC) 15 MG tablet  06/29/18   [provider]  omeprazole (PRILOSEC) 40 MG capsule as needed. 06/04/18   [provider]  ondansetron (ZOFRAN ODT) 4 MG disintegrating tablet Take 1 tablet (4 mg total) by mouth every 6 (six) hours as needed for nausea or vomiting. 07/19/18   Sharyn Creamer, MD  triamcinolone cream (KENALOG) 0.1 % Apply 1 application topically 2 (two) times daily.    [provider]    Allergies Sulfa antibiotics and Lisinopril  Family History  Problem Relation Age of Onset  . Cancer Mother        bladder and kidney cancer  . Hypertension Father   . Kidney disease Brother   . Rheum arthritis Paternal Grandmother     Social History Social History  Tobacco Use  . Smoking status: Never Smoker  . Smokeless tobacco: Never Used  Substance Use Topics  . Alcohol use: Yes    Comment: occ  . Drug use: No    Review of Systems Constitutional: No fever, chills, slight fatigue Eyes: No visual changes. ENT: No sore throat. Cardiovascular: Denies chest pain. Respiratory: Denies shortness of breath. Gastrointestinal: No abdominal pain except she reports sore in the area of the bladder.   Genitourinary: Pain with urination, foul odor, increased frequency musculoskeletal: Negative for back pain. Skin: Negative for rash. Neurological: Negative for headaches, areas of focal weakness or  numbness.    ____________________________________________   PHYSICAL EXAM:  VITAL SIGNS: ED Triage Vitals  Enc Vitals Group     BP 07/19/18 1532 (!) 154/90     Pulse Rate 07/19/18 1532 99     Resp 07/19/18 1532 16     Temp 07/19/18 1532 99 F (37.2 C)     Temp Source 07/19/18 1532 Oral     SpO2 07/19/18 1532 99 %     Weight 07/19/18 1534 225 lb (102.1 kg)     Height 07/19/18 1534 5' 6.26" (1.683 m)     Head Circumference --      Peak Flow --      Pain Score 07/19/18 1533 5     Pain Loc --      Pain Edu? --      Excl. in GC? --     Constitutional: Alert and oriented. Well appearing and in no acute distress.  She is very pleasant. Eyes: Conjunctivae are normal. Head: Atraumatic. Nose: No congestion/rhinnorhea. Mouth/Throat: Mucous membranes are moist. Neck: No stridor.  Cardiovascular: Normal rate, regular rhythm. Grossly normal heart sounds.  Good peripheral circulation. Respiratory: Normal respiratory effort.  No retractions. Lungs CTAB. Gastrointestinal: Soft and nontender except she does report discomfort suprapubically, there is no distention of the bladder. No distention.  No CVA tenderness bilateral.  No rebound or guarding in any quadrant. Musculoskeletal: No lower extremity tenderness nor edema. Neurologic:  Normal speech and language. No gross focal neurologic deficits are appreciated.  Skin:  Skin is warm, dry and intact. No rash noted. Psychiatric: Mood and affect are normal. Speech and behavior are normal.  ____________________________________________   LABS (all labs ordered are listed, but only abnormal results are displayed)  Labs Reviewed  COMPREHENSIVE METABOLIC PANEL - Abnormal; Notable for the following components:      Result Value   Potassium 3.4 (*)    Glucose, Bld 107 (*)    Calcium 8.8 (*)    ALT 48 (*)    All other components within normal limits  URINALYSIS, COMPLETE (UACMP) WITH MICROSCOPIC - Abnormal; Notable for the following  components:   Color, Urine AMBER (*)    APPearance CLEAR (*)    Hgb urine dipstick SMALL (*)    Protein, ur 30 (*)    Nitrite POSITIVE (*)    Leukocytes, UA MODERATE (*)    WBC, UA >50 (*)    Bacteria, UA RARE (*)    All other components within normal limits  LACTIC ACID, PLASMA  CBC WITH DIFFERENTIAL/PLATELET  LACTIC ACID, PLASMA   ____________________________________________  EKG   ____________________________________________  RADIOLOGY  Ct Renal Stone Study  Result Date: 07/19/2018 CLINICAL DATA:  Fever urinary frequency and back pain EXAM: CT ABDOMEN AND PELVIS WITHOUT CONTRAST TECHNIQUE: Multidetector CT imaging of the abdomen and pelvis was performed following the standard protocol without IV contrast. COMPARISON:  CT  03/05/2018 FINDINGS: Lower chest: Lung bases demonstrate no acute consolidation or effusion. The heart size is normal Hepatobiliary: No focal liver abnormality is seen. Status post cholecystectomy. No biliary dilatation. Pancreas: Unremarkable. No pancreatic ductal dilatation or surrounding inflammatory changes. Spleen: Normal in size without focal abnormality. Adrenals/Urinary Tract: Adrenal glands are normal. Mild left perinephric soft tissue stranding. Indistinct appearance of left renal pelvis with soft tissue stranding about the left ureter. Left ureter is mildly prominent. No ureteral stone. Bladder is normal Stomach/Bowel: Stomach is within normal limits. Appendix appears normal. No evidence of bowel wall thickening, distention, or inflammatory changes. Vascular/Lymphatic: Minor aortic atherosclerosis. No aneurysm. No significantly enlarged lymph nodes Reproductive: Uterus and bilateral adnexa are unremarkable. Other: Negative for free air or free fluid. Moderate fat containing umbilical hernia Musculoskeletal: No acute or significant osseous findings. IMPRESSION: 1. Negative for ureteral stone. There is left perinephric edema and soft tissue stranding with  prominence of left ureter and periureteral inflammatory change. Findings could be secondary to ascending urinary tract infection/pyelonephritis. 2. Otherwise no CT evidence for acute intra-abdominal or pelvic abnormality Electronically Signed   By: Jasmine PangKim  Fujinaga M.D.   On: 07/19/2018 18:58     CT reviewed, no stone disease.  Probable pyelonephritis involving the left ____________________________________________   PROCEDURES  Procedure(s) performed: None  Procedures  Critical Care performed: No  ____________________________________________   INITIAL IMPRESSION / ASSESSMENT AND PLAN / ED COURSE  Pertinent labs & imaging results that were available during my care of the patient were reviewed by me and considered in my medical decision making (see chart for details).   Patient presents for fever, body aches, notable urine odor, dysuria, some suprapubic abdominal discomfort.  Suspect likely urinary tract disease.  No signs or symptoms of acute abdomen.  No pulmonary or influenza-like symptoms.  Patient fully alert, ambulatory in no distress.  Low-grade temperature of 99 here, was 101 earlier, white blood cell count normal.  Patient well-appearing, nontoxic in appearance.  No signs to suggest the patient has developed sepsis at this time  Urine culture Sheronette is collected by the urology clinic earlier today from whom she was referred.  Page Dr. Lonna CobbStoioff to discuss as he was involved in the patient's care earlier today and was referring to the ED. Dr. Lonna CobbStoioff advised urinalysis at the office consistent with UTI.  ----------------------------------------- 7:25 PM on 07/19/2018 -----------------------------------------  Patient resting comfortably, no distress.  Able to tolerate by well by mouth pain well controlled.  Patient appears appropriate for ongoing outpatient care, her previous urinary tract infection demonstrated pansensitive E. coli.  We will discharge the patient to home, will  be following up closely with urology.  Urine culture performed by urologic department.   Return precautions and treatment recommendations and follow-up discussed with the patient who is agreeable with the plan.       ____________________________________________   FINAL CLINICAL IMPRESSION(S) / ED DIAGNOSES  Final diagnoses:  Pyelonephritis, acute        Note:  This document was prepared using Dragon voice recognition software and may include unintentional dictation errors       Sharyn CreamerQuale, Vinicio Lynk, MD 07/19/18 1925

## 2018-07-19 NOTE — Discharge Instructions (Signed)

## 2018-07-19 NOTE — ED Triage Notes (Signed)
Patient reports running fever of 103.0 that started on Saturday. Woke up Sunday and felt great and ran errands then started having urinary symptoms of urinary frequency, burning/painful urination, lower back pain. Went and saw urologist today and had a fever of 101.5 with back pain and was brought to ER.

## 2018-07-19 NOTE — Progress Notes (Signed)
Patient presents today with high fever and dysuria and Chills. Patient states her symptoms began on Saturday. A urine was collected for UA, UCX. Patient states she has not been on ABX or had any Urological surgeries in the last 30 days. I spoke to Dr. Lonna Cobb  and he reviewed the UA and per her symptoms he feels she should be evaluated in the ER, treated with IV abx.

## 2018-07-20 LAB — URINALYSIS, COMPLETE

## 2018-07-20 LAB — MICROSCOPIC EXAMINATION
RBC, UA: NONE SEEN /hpf (ref 0–2)
WBC, UA: >30 /hpf — ABNORMAL HIGH (ref 0–5)

## 2018-07-22 ENCOUNTER — Telehealth: Payer: Self-pay

## 2018-07-22 LAB — CULTURE, URINE COMPREHENSIVE

## 2018-07-22 NOTE — Telephone Encounter (Signed)
Patient notified on vmail per DPR 

## 2018-07-22 NOTE — Telephone Encounter (Signed)
-----   Message from Riki Altes, MD sent at 07/22/2018  3:02 PM EST ----- Urine culture was positive and sensitive to Augmentin

## 2018-08-09 ENCOUNTER — Encounter: Payer: Self-pay | Admitting: Certified Nurse Midwife

## 2018-10-05 ENCOUNTER — Telehealth (INDEPENDENT_AMBULATORY_CARE_PROVIDER_SITE_OTHER): Payer: 59 | Admitting: Urology

## 2018-10-05 ENCOUNTER — Telehealth: Payer: Self-pay | Admitting: Urology

## 2018-10-05 ENCOUNTER — Other Ambulatory Visit: Payer: Self-pay

## 2018-10-05 DIAGNOSIS — N2889 Other specified disorders of kidney and ureter: Secondary | ICD-10-CM

## 2018-10-05 DIAGNOSIS — Z87448 Personal history of other diseases of urinary system: Secondary | ICD-10-CM

## 2018-10-05 DIAGNOSIS — N390 Urinary tract infection, site not specified: Secondary | ICD-10-CM

## 2018-10-05 DIAGNOSIS — N3946 Mixed incontinence: Secondary | ICD-10-CM | POA: Diagnosis not present

## 2018-10-05 MED ORDER — MIRABEGRON ER 25 MG PO TB24: 25.0000 mg | ORAL_TABLET | Freq: Every day | ORAL | 3 refills | Status: DC

## 2018-10-05 MED ORDER — AMOXICILLIN 500 MG PO TABS: 500.0000 mg | ORAL_TABLET | Freq: Two times a day (BID) | ORAL | 0 refills | Status: DC

## 2018-10-05 NOTE — Telephone Encounter (Signed)
Would you call Mrs. Peil and schedule her for a three month follow up for an OAB questionnaire and PVR?

## 2018-10-05 NOTE — Progress Notes (Signed)
Virtual Visit via Telephone Note  I connected with [name] on 10/05/2018 at 0911 by audio/visual and verified that I am speaking with the correct person using two identifiers.  They are located at home in her kitchen.  I am located at my home.    This visit type was conducted due to national recommendations for restrictions regarding the COVID-19 Pandemic (e.g. social distancing).  This format is felt to be most appropriate for this patient at this time.  All issues noted in this document were discussed and addressed.  No physical exam was performed.   I discussed the limitations, risks, security and privacy concerns of performing an evaluation and management service by telephone and the availability of in person appointments. I also discussed with the patient that there may be a patient responsible charge related to this service. The patient expressed understanding and agreed to proceed.   History of Present Illness: Mrs. Greenway is a 58 year old [name] with a left ureterocele, mixed incontinence, a history of hematuria and rUTI's who is contacted today for follow up to discuss OAB symptoms.  She had been recently seen in the ED on 07/19/2018 for left pyelonephritis.  Her urine culture was positive for E.Coli that was resistant to ampicillin, ciprofloxacin, tetracycline and trimethoprim/sulfa.  She completed a course of Augmentin.  She then developed any episode of left flank pain associated with urinary symptoms and she had amoxicillin on hand for dental procedures and took seven days of amoxicillin 500 mg once daily.  Her symptoms resolved.  She started to have some left side pain today, but she was washing windows yesterday and feels that it is due to muscle soreness vs UTI as she does not have any urinary symptoms at this time.    She had been given [name] 4 mg and [name] 25 mg samples in May 2018.  She did not feel the [name]   worked for her and continued with the [name]  4 mg daily.  In August of 2019, her Gala Murdoch was increased to 8 mg daily.  The increase in the dose of [name] caused intolerable side effects, so she returned to the [name] 4 mg daily.   She has been without caffeine for one month and has lost about 15 pounds.  She is still having issues with incontinence and having to wear pads.  She states she is leaking when she stands up.  She says the urine just rushes out.  She is also finding the [name] 4 mg is continuing to cause dry mouth.     Observations/Objective: Mrs. Demaine is in her kitchen and well groomed.  She does not appear distressed.    Assessment and Plan:  1. rUTI's - advised patient that the ureterocele may need to be addressed if she continues to have episodes of pyelonephritis  - she will contact our office if her current left side pain starts to worsen or she starts to have associated UTI symptoms or fever for UA, culture and antibiotic - will have return visit in three months for recheck, if UTI's continue to be a problem will need to see sooner - prescription sent in to replace amoxicillin used for dental procedures   2. Mixed incontinence - will prescribe another trial of the [name] 25 mg daily to see if it will help her reach goal without intolerable side effects - we also discussed a referral to PT once the pandemic is over and it is safe to do so -  she will follow up in three months for OAB questionnaire and PVR  3. History of hematuria - Hematuria work up completed in 03/2018 - findings positive for left ureterocele - No report of gross hematuria  - RTC in one year for UA (03/2019)- patient to report any gross hematuria in the interim     Follow Up Instructions:  Mrs. Dan HumphreysWalker will return to the office in three months for symptom recheck, OAB questionnaire and PVR.     I discussed the assessment and treatment plan with the patient. The patient was provided an opportunity to ask questions and all were answered. The  patient agreed with the plan and demonstrated an understanding of the instructions.   The patient was advised to call back or seek an in-person evaluation if the symptoms worsen or if the condition fails to improve as anticipated.  I provided 18 minutes of face-to-face time during this encounter.   Ethelene Closser, PA-C

## 2018-10-06 ENCOUNTER — Other Ambulatory Visit: Payer: Self-pay

## 2018-10-08 ENCOUNTER — Ambulatory Visit: Payer: 59 | Admitting: Urology

## 2018-10-26 ENCOUNTER — Telehealth: Payer: Self-pay | Admitting: Urology

## 2018-10-26 NOTE — Telephone Encounter (Signed)
Prior authorization required for [name] 25mg  ER tablets  Cover My Meds Key: [id number] Patient Last Name: TURCO DOB: [birth year]

## 2018-10-27 NOTE — Telephone Encounter (Signed)
[name] 25mg  has been approved. Please notify patient

## 2018-10-27 NOTE — Telephone Encounter (Signed)
PA Key: Racheal Patches was closed when I logged in. I renewed the RX PA request and new Key is [id number] Waiting on response from cover My Meds.

## 2018-10-28 NOTE — Telephone Encounter (Signed)
Notified through mychart. 

## 2019-01-06 NOTE — Progress Notes (Signed)
01/07/2019 10:09 AM   [name] [birth year] [id number]  Referring provider: No referring provider defined for this encounter.  Chief Complaint  Patient presents with  . Urinary Incontinence    HPI: [name] is a [age] year old female with a history of hematuria, rUTI's and mixed incontinence who presents today for a follow up.  History of hematuria Non-smoker.  CTU in 02/2018 revealed no adrenal masses identified. No evidence of urolithiasis or hydronephrosis. Tiny benign-appearing left renal cyst noted. No complex cystic or solid renal masses identified. No masses seen involving the ureters or bladder. Tiny left ureterocele incidentally noted.  Cystoscopy with Dr. Apolinar JunesBrandon in 03/2018 NED - left ureterocele seen.  She does not report gross hematuria.   rUTI's + citrobacter freundii resistant to Augmentin, cefazolin and cefuroxime in 01/2018 + E.coli x 2 in 04/2018 + E.coli resistant to ampicillin, ciprofloxacin, tetracycline and trimethoprim/sulfa in 07/2018  Mixed incontinence The patient is  experiencing urgency x 4-7, frequency x 4-7, is not restricting fluids to avoid visits to the restroom, is engaging in toilet mapping, incontinence x 4-7 and nocturia x 1-2.   Her BP is 136/92.   Her PVR is 0 mL.  Her main complaints at this time are frequency, urgency, nocturia, incontinence and a weak urinary stream.   She states the [name] did not cause a dry mouth, but it was not effective in treating her urinary symptoms.  She recently had a massage and when the therapist was at her left flank, [name] found it very tender.  The massage therapist then asked her if she had trauma and/or an injury in that area and that is the side she had a previous pyelonephritis on in February.  She also has a history of ureterocele on the left as well.  She is very concerned about the flank pain and would like to have an imaging study to ensure there is nothing wrong with her kidney.  Patient  denies any gross hematuria, dysuria or suprapubic/flank pain.  Patient denies any fevers, chills, nausea or vomiting.   PMH: Past Medical History:  Diagnosis Date  . Arthritis   . Endometriosis   . Hypertension   . Lichen sclerosus     Surgical History: Past Surgical History:  Procedure Laterality Date  . BREAST SURGERY Left    breast Bx/ fat necrosis  . CESAREAN SECTION    . CHOLECYSTECTOMY    . DILATION AND CURETTAGE OF UTERUS    . HYSTEROSCOPY     resection of polyps  . LAPAROSCOPY     endometriosis  . NOVASURE ABLATION  03/26/2009  . REDUCTION MAMMAPLASTY Bilateral 07/28/08   Dr. Chales AbrahamsMary Ann Contaginasis  . RHINOPLASTY    . VAGINAL DELIVERY     x2    Home Medications:  Allergies as of 01/07/2019      Reactions   Sulfa Antibiotics Swelling   Lisinopril Other (See Comments)      Medication List       Accurate as of January 07, 2019 10:09 AM. If you have any questions, ask your nurse or doctor.        STOP taking these medications   amoxicillin 500 MG tablet Commonly known as: AMOXIL Stopped by: Aubry Rankin, PA-C   amoxicillin-clavulanate 875-125 MG tablet Commonly known as: Augmentin Stopped by: Michiel CowboySHANNON Jesslynn Kruck, PA-C   fesoterodine 4 MG Tb24 tablet Commonly known as: [name] Stopped by: Michiel CowboySHANNON Tennessee Perra, PA-C   meloxicam 15 MG tablet Commonly known as:  MOBIC Stopped by: Carollee HerterSHANNON Gisell Buehrle, PA-C   ondansetron 4 MG disintegrating tablet Commonly known as: Zofran ODT Stopped by: Tanay Misuraca, PA-C     TAKE these medications   losartan 50 MG tablet Commonly known as: COZAAR Take by mouth.   mirabegron ER 25 MG Tb24 tablet Commonly known as: [name] Take 1 tablet (25 mg total) by mouth daily.   omeprazole 40 MG capsule Commonly known as: PRILOSEC as needed.   triamcinolone cream 0.1 % Commonly known as: KENALOG Apply 1 application topically 2 (two) times daily.       Allergies:  Allergies  Allergen Reactions  . Sulfa Antibiotics  Swelling  . Lisinopril Other (See Comments)    Family History: Family History  Problem Relation Age of Onset  . Cancer Mother        bladder and kidney cancer  . Hypertension Father   . Kidney disease Brother   . Rheum arthritis Paternal Grandmother     Social History:  reports that she has never smoked. She has never used smokeless tobacco. She reports current alcohol use. She reports that she does not use drugs.  ROS: UROLOGY Frequent Urination?: Yes Hard to postpone urination?: Yes Burning/pain with urination?: No Get up at night to urinate?: Yes Leakage of urine?: Yes Urine stream starts and stops?: No Trouble starting stream?: No Do you have to strain to urinate?: No Blood in urine?: No Urinary tract infection?: No Sexually transmitted disease?: No Injury to kidneys or bladder?: No Painful intercourse?: Yes Weak stream?: Yes Currently pregnant?: No Vaginal bleeding?: No Last menstrual period?: n  Gastrointestinal Nausea?: No Vomiting?: No Indigestion/heartburn?: Yes Diarrhea?: No Constipation?: Yes  Constitutional Fever: No Night sweats?: Yes Weight loss?: No Fatigue?: No  Skin Skin rash/lesions?: No Itching?: No  Eyes Blurred vision?: No Double vision?: No  Ears/Nose/Throat Sore throat?: No Sinus problems?: No  Hematologic/Lymphatic Swollen glands?: No Easy bruising?: No  Cardiovascular Leg swelling?: No Chest pain?: No  Respiratory Cough?: No Shortness of breath?: No  Endocrine Excessive thirst?: No  Musculoskeletal Back pain?: Yes Joint pain?: Yes  Neurological Headaches?: No Dizziness?: No  Psychologic Depression?: No Anxiety?: No  Physical Exam: BP (!) 136/92 (BP Location: Left Arm, Patient Position: Sitting, Cuff Size: Normal)   Pulse 69   Ht 5' 6.26" (1.683 m)   Wt 222 lb (100.7 kg)   BMI 35.55 kg/m   Constitutional:  Well nourished. Alert and oriented, No acute distress. HEENT: [place] AT, moist mucus membranes.   Trachea midline, no masses. Cardiovascular: No clubbing, cyanosis, or edema. Respiratory: Normal respiratory effort, no increased work of breathing. Neurologic: Grossly intact, no focal deficits, moving all 4 extremities. Psychiatric: Normal mood and affect.   Laboratory Data: Lab Results  Component Value Date   WBC 8.3 07/19/2018   HGB 13.1 07/19/2018   HCT 39.9 07/19/2018   MCV 83.5 07/19/2018   PLT 216 07/19/2018    Lab Results  Component Value Date   CREATININE 0.78 07/19/2018    No results found for: PSA  No results found for: TESTOSTERONE  No results found for: HGBA1C  Lab Results  Component Value Date   TSH 4.15 08/09/2015       Component Value Date/Time   CHOL 198 08/09/2015 0857   HDL 37 (L) 08/09/2015 0857   CHOLHDL 5.4 (H) 08/09/2015 0857   VLDL 21 08/09/2015 0857   LDLCALC 140 (H) 08/09/2015 0857    Lab Results  Component Value Date   AST 25 07/19/2018  Lab Results  Component Value Date   ALT 48 (H) 07/19/2018   No components found for: ALKALINEPHOPHATASE No components found for: BILIRUBINTOTAL  No results found for: ESTRADIOL  Urinalysis    Component Value Date/Time   COLORURINE AMBER (A) 07/19/2018 1729   APPEARANCEUR CLEAR (A) 07/19/2018 1729   APPEARANCEUR Cloudy (A) 07/19/2018 1456   LABSPEC 1.017 07/19/2018 1729   PHURINE 6.0 07/19/2018 1729   GLUCOSEU NEGATIVE 07/19/2018 1729   HGBUR SMALL (A) 07/19/2018 1729   BILIRUBINUR NEGATIVE 07/19/2018 1729   BILIRUBINUR n 07/02/2018 0932   BILIRUBINUR Negative 04/29/2018 1500   KETONESUR NEGATIVE 07/19/2018 1729   PROTEINUR 30 (A) 07/19/2018 1729   UROBILINOGEN negative (A) 07/02/2018 0932   UROBILINOGEN 0.2 11/15/2011 0751   NITRITE POSITIVE (A) 07/19/2018 1729   LEUKOCYTESUR MODERATE (A) 07/19/2018 1729   LEUKOCYTESUR 1+ (A) 04/29/2018 1500    I have reviewed the labs.   Pertinent Imaging: Results for TAIZ, BICKLE (MRN [id number]) as of 01/07/2019 09:58  Ref. Range  01/07/2019 09:36  Scan Result Unknown 0     Assessment & Plan:    1. rUTI's - asymptomatic at this visit   2. Mixed incontinence - failed OAB medications and would like to have an appointment with Dr. Matilde Sprang as she feels she has seen him in the past  3. History of hematuria - Hematuria work up completed in 03/2018 - findings positive for left ureterocele - No report of gross hematuria  - RTC in one year for UA (03/2019)- patient to report any gross hematuria in the interim    4. Left flank pain - history of left ureterocele and a recent pyelonephritis  - will obtain a RUS for further evaluation - I will call patient with results   Return for appointment with Dr.MacDiarmid for incontinence .  These notes generated with voice recognition software. I apologize for typographical errors.  Zara Council, PA-C  [facility] [address]  [place], [place] [address] [phone]

## 2019-01-07 ENCOUNTER — Other Ambulatory Visit: Payer: Self-pay

## 2019-01-07 ENCOUNTER — Ambulatory Visit (INDEPENDENT_AMBULATORY_CARE_PROVIDER_SITE_OTHER): Payer: 59 | Admitting: Urology

## 2019-01-07 ENCOUNTER — Encounter: Payer: Self-pay | Admitting: Urology

## 2019-01-07 VITALS — BP 136/92 | HR 69 | Ht 66.26 in | Wt 222.0 lb

## 2019-01-07 DIAGNOSIS — Z87448 Personal history of other diseases of urinary system: Secondary | ICD-10-CM | POA: Diagnosis not present

## 2019-01-07 DIAGNOSIS — R109 Unspecified abdominal pain: Secondary | ICD-10-CM | POA: Diagnosis not present

## 2019-01-07 DIAGNOSIS — N390 Urinary tract infection, site not specified: Secondary | ICD-10-CM | POA: Diagnosis not present

## 2019-01-07 DIAGNOSIS — N3946 Mixed incontinence: Secondary | ICD-10-CM | POA: Diagnosis not present

## 2019-01-07 LAB — BLADDER SCAN AMB NON-IMAGING: Scan Result: 0

## 2019-01-21 ENCOUNTER — Other Ambulatory Visit: Payer: Self-pay

## 2019-01-21 ENCOUNTER — Ambulatory Visit
Admission: RE | Admit: 2019-01-21 | Discharge: 2019-01-21 | Disposition: A | Discharge status: Home / Self Care | Payer: 59 | Source: Ambulatory Visit | Attending: Urology | Admitting: Urology

## 2019-01-21 DIAGNOSIS — R109 Unspecified abdominal pain: Secondary | ICD-10-CM

## 2019-01-31 ENCOUNTER — Ambulatory Visit: Payer: 59 | Admitting: Urology

## 2019-03-07 ENCOUNTER — Encounter: Payer: Self-pay | Admitting: Urology

## 2019-03-07 ENCOUNTER — Ambulatory Visit (INDEPENDENT_AMBULATORY_CARE_PROVIDER_SITE_OTHER): Payer: 59 | Admitting: Urology

## 2019-03-07 ENCOUNTER — Other Ambulatory Visit: Payer: Self-pay

## 2019-03-07 VITALS — BP 164/97 | HR 76 | Ht 66.26 in | Wt 222.0 lb

## 2019-03-07 DIAGNOSIS — N3946 Mixed incontinence: Secondary | ICD-10-CM

## 2019-03-07 MED ORDER — TRIMETHOPRIM 100 MG PO TABS: 100.0000 mg | ORAL_TABLET | Freq: Every day | ORAL | 11 refills | Status: DC

## 2019-03-07 NOTE — Progress Notes (Signed)
03/07/2019 9:09 AM   [name] [birth year] [id number]  Referring provider: No referring provider defined for this encounter.  Chief Complaint  Patient presents with  . Urinary Incontinence    HPI: [name]: Has had a CT scan and has a left ureterocele assessed by Dr. Erlene Quan in 2019.  Has a history of recurrent bladder infections.  She failed [name] for mixed incontinence.  Recently had a normal renal ultrasound for left flank pain.  Day Patient leaks with coughing sneezing bending lifting.  Primary complaint is urge incontinence.  No bedwetting.  Wears 2 or 3 pads a day that are damp.  She gets urgency and then reports a slow dribbling flow.  She can double void a small amount.  She voids 3 or 4 times a day.  She gets up once or twice at night  In February she describes pyelonephritis and 2 bladder infections since with burning and not feeling well that respond favorably to antibiotics.  No neurologic issues.  Bowel movements vary.  No previous bladder surgery.  Modifying factors: There are no other modifying factors  Associated signs and symptoms: There are no other associated signs and symptoms Aggravating and relieving factors: There are no other aggravating or relieving factors Severity: Moderate Duration: Persistent   PMH: Past Medical History:  Diagnosis Date  . Arthritis   . Endometriosis   . Hypertension   . Lichen sclerosus     Surgical History: Past Surgical History:  Procedure Laterality Date  . BREAST SURGERY Left    breast Bx/ fat necrosis  . CESAREAN SECTION    . CHOLECYSTECTOMY    . DILATION AND CURETTAGE OF UTERUS    . HYSTEROSCOPY     resection of polyps  . LAPAROSCOPY     endometriosis  . NOVASURE ABLATION  03/26/2009  . REDUCTION MAMMAPLASTY Bilateral 07/28/08   Dr. Audrea Muscat Contaginasis  . RHINOPLASTY    . VAGINAL DELIVERY     x2    Home Medications:  Allergies as of 03/07/2019      Reactions   Sulfa Antibiotics Swelling   Lisinopril Other (See Comments)      Medication List       Accurate as of March 07, 2019  9:09 AM. If you have any questions, ask your nurse or doctor.        losartan 50 MG tablet Commonly known as: COZAAR Take by mouth.   mirabegron ER 25 MG Tb24 tablet Commonly known as: [name] Take 1 tablet (25 mg total) by mouth daily.   omeprazole 40 MG capsule Commonly known as: PRILOSEC as needed.   triamcinolone cream 0.1 % Commonly known as: KENALOG Apply 1 application topically 2 (two) times daily.       Allergies:  Allergies  Allergen Reactions  . Sulfa Antibiotics Swelling  . Lisinopril Other (See Comments)    Family History: Family History  Problem Relation Age of Onset  . Cancer Mother        bladder and kidney cancer  . Hypertension Father   . Kidney disease Brother   . Rheum arthritis Paternal Grandmother     Social History:  reports that she has never smoked. She has never used smokeless tobacco. She reports current alcohol use. She reports that she does not use drugs.  ROS: UROLOGY Frequent Urination?: Yes Hard to postpone urination?: Yes Burning/pain with urination?: No Get up at night to urinate?: Yes Leakage of urine?: Yes Urine stream starts and stops?: Yes Trouble starting  stream?: No Do you have to strain to urinate?: No Blood in urine?: No Urinary tract infection?: No Sexually transmitted disease?: No Injury to kidneys or bladder?: No Painful intercourse?: No Weak stream?: No Currently pregnant?: No Vaginal bleeding?: No Last menstrual period?: n  Gastrointestinal Nausea?: No Vomiting?: No Indigestion/heartburn?: No Diarrhea?: No Constipation?: No  Constitutional Fever: No Night sweats?: No Weight loss?: No Fatigue?: No  Skin Skin rash/lesions?: No Itching?: No  Eyes Blurred vision?: No Double vision?: No  Ears/Nose/Throat Sore throat?: No Sinus problems?: No  Hematologic/Lymphatic Swollen glands?: No Easy  bruising?: No  Cardiovascular Leg swelling?: No Chest pain?: No  Respiratory Cough?: No Shortness of breath?: No  Endocrine Excessive thirst?: No  Musculoskeletal Back pain?: No Joint pain?: No  Neurological Headaches?: No Dizziness?: No  Psychologic Depression?: No Anxiety?: No  Physical Exam: BP (!) 164/97   Pulse 76   Ht 5' 6.26" (1.683 m)   Wt 222 lb (100.7 kg)   BMI 35.55 kg/m   Constitutional:  Alert and oriented, No acute distress. HEENT: [place] AT, moist mucus membranes.  Trachea midline, no masses. Cardiovascular: No clubbing, cyanosis, or edema. Respiratory: Normal respiratory effort, no increased work of breathing. GI: Abdomen is soft, nontender, nondistended, no abdominal masses GU: No CVA tenderness.  Patient could not cough and had grade 1 hypermobility the bladder neck.  Exam a little bit limited due to obesity.  She has sub-urethrovaginal.  I do not think she has a diverticulum.  No stress incontinence Skin: No rashes, bruises or suspicious lesions. Lymph: No cervical or inguinal adenopathy. Neurologic: Grossly intact, no focal deficits, moving all 4 extremities. Psychiatric: Normal mood and affect.  Laboratory Data: Lab Results  Component Value Date   WBC 8.3 07/19/2018   HGB 13.1 07/19/2018   HCT 39.9 07/19/2018   MCV 83.5 07/19/2018   PLT 216 07/19/2018    Lab Results  Component Value Date   CREATININE 0.78 07/19/2018    No results found for: PSA  No results found for: TESTOSTERONE  No results found for: HGBA1C  Urinalysis    Component Value Date/Time   COLORURINE AMBER (A) 07/19/2018 1729   APPEARANCEUR CLEAR (A) 07/19/2018 1729   APPEARANCEUR Cloudy (A) 07/19/2018 1456   LABSPEC 1.017 07/19/2018 1729   PHURINE 6.0 07/19/2018 1729   GLUCOSEU NEGATIVE 07/19/2018 1729   HGBUR SMALL (A) 07/19/2018 1729   BILIRUBINUR NEGATIVE 07/19/2018 1729   BILIRUBINUR n 07/02/2018 0932   BILIRUBINUR Negative 04/29/2018 1500   KETONESUR  NEGATIVE 07/19/2018 1729   PROTEINUR 30 (A) 07/19/2018 1729   UROBILINOGEN negative (A) 07/02/2018 0932   UROBILINOGEN 0.2 11/15/2011 0751   NITRITE POSITIVE (A) 07/19/2018 1729   LEUKOCYTESUR MODERATE (A) 07/19/2018 1729   LEUKOCYTESUR 1+ (A) 04/29/2018 1500    Pertinent Imaging:   Assessment & Plan: Patient has mixed incontinence.  She recently had a normal renal ultrasound and pathophysiology for recurrent bladder infections described.  She is currently a partial responder to [name] and failed [name] in the past due to dry mouth.  I want to see if her mixed symptoms down regulated on suppression therapy.  I mentioned in the future if she still leaking I would order urodynamics.  Reassess patient on trimethoprim suppression therapy and [name] in 8 weeks and order urodynamics if she is not down regulated her incontinence  There are no diagnoses linked to this encounter.  No follow-ups on file.  Martina Sinner, MD  [facility] [address]  [place], [place] [address] [phone]

## 2019-03-13 ENCOUNTER — Other Ambulatory Visit: Payer: Self-pay | Admitting: Urology

## 2019-04-11 ENCOUNTER — Other Ambulatory Visit: Payer: Self-pay | Admitting: Urology

## 2019-05-09 ENCOUNTER — Ambulatory Visit: Payer: 59 | Admitting: Urology

## 2019-05-27 ENCOUNTER — Encounter: Payer: Self-pay | Admitting: Certified Nurse Midwife

## 2019-06-20 ENCOUNTER — Ambulatory Visit (INDEPENDENT_AMBULATORY_CARE_PROVIDER_SITE_OTHER): Payer: 59 | Admitting: Urology

## 2019-06-20 ENCOUNTER — Other Ambulatory Visit: Payer: Self-pay

## 2019-06-20 VITALS — BP 160/93 | HR 68 | Ht 69.0 in | Wt 222.0 lb

## 2019-06-20 DIAGNOSIS — N3946 Mixed incontinence: Secondary | ICD-10-CM | POA: Diagnosis not present

## 2019-06-20 LAB — URINALYSIS, COMPLETE
Bilirubin, UA: NEGATIVE
Glucose, UA: NEGATIVE
Ketones, UA: NEGATIVE
Leukocytes,UA: NEGATIVE
Nitrite, UA: NEGATIVE
Protein,UA: NEGATIVE
Specific Gravity, UA: 1.025 (ref 1.005–1.030)
Urobilinogen, Ur: 0.2 mg/dL (ref 0.2–1.0)
pH, UA: 5.5 (ref 5.0–7.5)

## 2019-06-20 LAB — MICROSCOPIC EXAMINATION

## 2019-06-20 NOTE — Progress Notes (Signed)
06/20/2019 11:23 AM   [name] Oct 07, 1960 [id number]  Referring provider: No referring provider defined for this encounter.  Chief Complaint  Patient presents with  . Follow-up    HPI: [name]: Has had a CT scan and has a left ureterocele assessed by Dr. Erlene Quan in 2019.  Has a history of recurrent bladder infections.  She failed [name] for mixed incontinence.  Recently had a normal renal ultrasound for left flank pain.  Patient leaks with coughing sneezing bending lifting.  Primary complaint is urge incontinence.  No bedwetting.  Wears 2 or 3 pads a day that are damp.  She gets urgency and then reports a slow dribbling flow.  She can double void a small amount.  She voids 3 or 4 times a day.  She gets up once or twice at night  In February she describes pyelonephritis and 2 bladder infections since with burning and not feeling well that respond favorably to antibiotics.   Patient could not cough and had grade 1 hypermobility the bladder neck.  Exam a little bit limited due to obesity.  She has sub-urethrovaginal swelling.  I do not think she has a diverticulum.  No stress incontinence  Patient has mixed incontinence. She is currently a partial responder to [name] and failed [name] in the past due to dry mouth.  I want to see if her mixed symptoms down regulated on suppression therapy.  I mentioned in the future if she still leaking I would order urodynamics.   Reassess patient on trimethoprim suppression therapy and [name] in 8 weeks and order urodynamics if she is not down regulated her incontinence  Today No infection on trimethoprim Incontinence the same See some pink or blood on pad that is intermittent.  Has not had a hysterectomy but has had an ablation.  She does not necessarily think it is from her urine but has not 100% sure.  No pain  History of aspirin blood thinner or smoking  In the last years had 2 CT scans one with contrast negative for stone  or malignancy.  She has been assessed by Dr. Erlene Quan noted above.  I did not find any past history of microscopic hematuria though the CT scan was ordered for this  Her ablation was for uterine bleeding 10 years ago  On pelvic examination using a double speculum she had petechiae diffuse throughout the vaginal epithelium.  I did not get a good look at the cervix.  PMH: Past Medical History:  Diagnosis Date  . Arthritis   . Endometriosis   . Hypertension   . Lichen sclerosus     Surgical History: Past Surgical History:  Procedure Laterality Date  . BREAST SURGERY Left    breast Bx/ fat necrosis  . CESAREAN SECTION    . CHOLECYSTECTOMY    . DILATION AND CURETTAGE OF UTERUS    . HYSTEROSCOPY     resection of polyps  . LAPAROSCOPY     endometriosis  . NOVASURE ABLATION  03/26/2009  . REDUCTION MAMMAPLASTY Bilateral 07/28/08   Dr. Audrea Muscat Contaginasis  . RHINOPLASTY    . VAGINAL DELIVERY     x2    Home Medications:  Allergies as of 06/20/2019      Reactions   Sulfa Antibiotics Swelling   Lisinopril Other (See Comments)      Medication List       Accurate as of June 20, 2019 11:23 AM. If you have any questions, ask your nurse or doctor.  STOP taking these medications   losartan 50 MG tablet Commonly known as: COZAAR Stopped by: Martina Sinner, MD     TAKE these medications   [name] 25 MG Tb24 tablet Generic drug: mirabegron ER TAKE 1 TABLET BY MOUTH EVERY DAY   omeprazole 40 MG capsule Commonly known as: PRILOSEC as needed.   triamcinolone cream 0.1 % Commonly known as: KENALOG Apply 1 application topically 2 (two) times daily.   trimethoprim 100 MG tablet Commonly known as: TRIMPEX Take 1 tablet (100 mg total) by mouth daily.       Allergies:  Allergies  Allergen Reactions  . Sulfa Antibiotics Swelling  . Lisinopril Other (See Comments)    Family History: Family History  Problem Relation Age of Onset  . Cancer Mother         bladder and kidney cancer  . Hypertension Father   . Kidney disease Brother   . Rheum arthritis Paternal Grandmother     Social History:  reports that she has never smoked. She has never used smokeless tobacco. She reports current alcohol use. She reports that she does not use drugs.  ROS: UROLOGY Frequent Urination?: Yes Hard to postpone urination?: Yes Burning/pain with urination?: No Get up at night to urinate?: No Leakage of urine?: Yes Urine stream starts and stops?: No Trouble starting stream?: No Do you have to strain to urinate?: No Blood in urine?: No Urinary tract infection?: No Sexually transmitted disease?: No Injury to kidneys or bladder?: No Painful intercourse?: No Weak stream?: No Currently pregnant?: No Vaginal bleeding?: No Last menstrual period?: n  Gastrointestinal Nausea?: No Vomiting?: No Indigestion/heartburn?: No Diarrhea?: No Constipation?: No  Constitutional Fever: No Night sweats?: No Weight loss?: No Fatigue?: No  Skin Skin rash/lesions?: No Itching?: No  Eyes Blurred vision?: No Double vision?: No  Ears/Nose/Throat Sore throat?: No Sinus problems?: No  Hematologic/Lymphatic Swollen glands?: No Easy bruising?: No  Cardiovascular Leg swelling?: No Chest pain?: No  Respiratory Cough?: No Shortness of breath?: No  Endocrine Excessive thirst?: No  Musculoskeletal Back pain?: No Joint pain?: No  Neurological Headaches?: No Dizziness?: No  Psychologic Depression?: No Anxiety?: No  Physical Exam: BP (!) 160/93   Pulse 68   Ht 5\' 9"  (1.753 m)   Wt 222 lb (100.7 kg)   BMI 32.78 kg/m   Constitutional:  Alert and oriented, No acute distress.   Laboratory Data: Lab Results  Component Value Date   WBC 8.3 07/19/2018   HGB 13.1 07/19/2018   HCT 39.9 07/19/2018   MCV 83.5 07/19/2018   PLT 216 07/19/2018    Lab Results  Component Value Date   CREATININE 0.78 07/19/2018    No results found for: PSA   No results found for: TESTOSTERONE  No results found for: HGBA1C  Urinalysis    Component Value Date/Time   COLORURINE AMBER (A) 07/19/2018 1729   APPEARANCEUR CLEAR (A) 07/19/2018 1729   APPEARANCEUR Cloudy (A) 07/19/2018 1456   LABSPEC 1.017 07/19/2018 1729   PHURINE 6.0 07/19/2018 1729   GLUCOSEU NEGATIVE 07/19/2018 1729   HGBUR SMALL (A) 07/19/2018 1729   BILIRUBINUR NEGATIVE 07/19/2018 1729   BILIRUBINUR n 07/02/2018 0932   BILIRUBINUR Negative 04/29/2018 1500   KETONESUR NEGATIVE 07/19/2018 1729   PROTEINUR 30 (A) 07/19/2018 1729   UROBILINOGEN negative (A) 07/02/2018 0932   UROBILINOGEN 0.2 11/15/2011 0751   NITRITE POSITIVE (A) 07/19/2018 1729   LEUKOCYTESUR MODERATE (A) 07/19/2018 1729   LEUKOCYTESUR 1+ (A) 04/29/2018 1500  Pertinent Imaging:   Assessment & Plan: I believe the findings vaginally are benign and represent likely the source of her spotting.  Gynecology referral recommended.  Role of urodynamics for incontinence discussed.  She has an appointment next week with her gynecologist.  She wants to see me on trimethoprim in 4 months.  We might order urodynamics in the future or not  There are no diagnoses linked to this encounter.  No follow-ups on file.  Martina Sinner, MD  [facility] [address] [place], [place] [address] [phone]

## 2019-07-08 ENCOUNTER — Ambulatory Visit: Payer: 59 | Admitting: Certified Nurse Midwife

## 2019-07-10 ENCOUNTER — Other Ambulatory Visit: Payer: Self-pay | Admitting: Urology

## 2019-07-22 ENCOUNTER — Encounter: Payer: Self-pay | Admitting: Certified Nurse Midwife

## 2019-07-22 ENCOUNTER — Other Ambulatory Visit: Payer: Self-pay

## 2019-07-22 ENCOUNTER — Ambulatory Visit: Payer: 59 | Admitting: Certified Nurse Midwife

## 2019-07-22 ENCOUNTER — Other Ambulatory Visit (HOSPITAL_COMMUNITY)
Admission: RE | Admit: 2019-07-22 | Discharge: 2019-07-22 | Disposition: A | Discharge status: Home / Self Care | Payer: 59 | Source: Ambulatory Visit | Attending: Certified Nurse Midwife | Admitting: Certified Nurse Midwife

## 2019-07-22 VITALS — BP 120/80 | HR 68 | Temp 98.1°F | Resp 16 | Ht 66.25 in | Wt 237.0 lb

## 2019-07-22 DIAGNOSIS — Z01419 Encounter for gynecological examination (general) (routine) without abnormal findings: Secondary | ICD-10-CM

## 2019-07-22 DIAGNOSIS — Z87898 Personal history of other specified conditions: Secondary | ICD-10-CM

## 2019-07-22 DIAGNOSIS — N951 Menopausal and female climacteric states: Secondary | ICD-10-CM

## 2019-07-22 DIAGNOSIS — Z124 Encounter for screening for malignant neoplasm of cervix: Secondary | ICD-10-CM | POA: Insufficient documentation

## 2019-07-22 NOTE — Patient Instructions (Signed)
EXERCISE AND DIET:  We recommended that you start or continue a regular exercise program for good health. Regular exercise means any activity that makes your heart beat faster and makes you sweat.  We recommend exercising at least 30 minutes per day at least 3 days a week, preferably 4 or 5.  We also recommend a diet low in fat and sugar.  Inactivity, poor dietary choices and obesity can cause diabetes, heart attack, stroke, and kidney damage, among others.    ALCOHOL AND SMOKING:  Women should limit their alcohol intake to no more than 7 drinks/beers/glasses of wine (combined, not each!) per week. Moderation of alcohol intake to this level decreases your risk of breast cancer and liver damage. And of course, no recreational drugs are part of a healthy lifestyle.  And absolutely no smoking or even second hand smoke. Most people know smoking can cause heart and lung diseases, but did you know it also contributes to weakening of your bones? Aging of your skin?  Yellowing of your teeth and nails?  CALCIUM AND VITAMIN D:  Adequate intake of calcium and Vitamin D are recommended.  The recommendations for exact amounts of these supplements seem to change often, but generally speaking 600 mg of calcium (either carbonate or citrate) and 800 units of Vitamin D per day seems prudent. Certain women may benefit from higher intake of Vitamin D.  If you are among these women, your doctor will have told you during your visit.    PAP SMEARS:  Pap smears, to check for cervical cancer or precancers,  have traditionally been done yearly, although recent scientific advances have shown that most women can have pap smears less often.  However, every woman still should have a physical exam from her gynecologist every year. It will include a breast check, inspection of the vulva and vagina to check for abnormal growths or skin changes, a visual exam of the cervix, and then an exam to evaluate the size and shape of the uterus and  ovaries.  And after 59 years of age, a rectal exam is indicated to check for rectal cancers. We will also provide age appropriate advice regarding health maintenance, like when you should have certain vaccines, screening for sexually transmitted diseases, bone density testing, colonoscopy, mammograms, etc.   MAMMOGRAMS:  All women over 40 years old should have a yearly mammogram. Many facilities now offer a "3D" mammogram, which may cost around $50 extra out of pocket. If possible,  we recommend you accept the option to have the 3D mammogram performed.  It both reduces the number of women who will be called back for extra views which then turn out to be normal, and it is better than the routine mammogram at detecting truly abnormal areas.    COLONOSCOPY:  Colonoscopy to screen for colon cancer is recommended for all women at age 50.  We know, you hate the idea of the prep.  We agree, BUT, having colon cancer and not knowing it is worse!!  Colon cancer so often starts as a polyp that can be seen and removed at colonscopy, which can quite literally save your life!  And if your first colonoscopy is normal and you have no family history of colon cancer, most women don't have to have it again for 10 years.  Once every ten years, you can do something that may end up saving your life, right?  We will be happy to help you get it scheduled when you are ready.    Be sure to check your insurance coverage so you understand how much it will cost.  It may be covered as a preventative service at no cost, but you should check your particular policy.      Urinary Incontinence  Urinary incontinence refers to a condition in which a person is unable to control where and when to pass urine. A person with this condition will urinate when he or she does not mean to (involuntarily). What are the causes? This condition may be caused by:  Medicines.  Infections.  Constipation.  Overactive bladder muscles.  Weak bladder  muscles.  Weak pelvic floor muscles. These muscles provide support for the bladder, intestine, and, in women, the uterus.  Enlarged prostate in men. The prostate is a gland near the bladder. When it gets too big, it can pinch the urethra. With the urethra blocked, the bladder can weaken and lose the ability to empty properly.  Surgery.  Emotional factors, such as anxiety, stress, or post-traumatic stress disorder (PTSD).  Pelvic organ prolapse. This happens in women when organs shift out of place and into the vagina. This shift can prevent the bladder and urethra from working properly. What increases the risk? The following factors may make you more likely to develop this condition:  Older age.  Obesity and physical inactivity.  Pregnancy and childbirth.  Menopause.  Diseases that affect the nerves or spinal cord (neurological diseases).  Long-term (chronic) coughing. This can increase pressure on the bladder and pelvic floor muscles. What are the signs or symptoms? Symptoms may vary depending on the type of urinary incontinence you have. They include:  A sudden urge to urinate, but passing urine involuntarily before you can get to a bathroom (urge incontinence).  Suddenly passing urine with any activity that forces urine to pass, such as coughing, laughing, exercise, or sneezing (stress incontinence).  Needing to urinate often, but urinating only a small amount, or constantly dribbling urine (overflow incontinence).  Urinating because you cannot get to the bathroom in time due to a physical disability, such as arthritis or injury, or communication and thinking problems, such as Alzheimer disease (functional incontinence). How is this diagnosed? This condition may be diagnosed based on:  Your medical history.  A physical exam.  Tests, such as: ? Urine tests. ? X-rays of your kidney and bladder. ? Ultrasound. ? CT scan. ? Cystoscopy. In this procedure, a health care  provider inserts a tube with a light and camera (cystoscope) through the urethra and into the bladder in order to check for problems. ? Urodynamic testing. These tests assess how well the bladder, urethra, and sphincter can store and release urine. There are different types of urodynamic tests, and they vary depending on what the test is measuring. To help diagnose your condition, your health care provider may recommend that you keep a log of when you urinate and how much you urinate. How is this treated? Treatment for this condition depends on the type of incontinence that you have and its cause. Treatment may include:  Lifestyle changes, such as: ? Quitting smoking. ? Maintaining a healthy weight. ? Staying active. Try to get 150 minutes of moderate-intensity exercise every week. Ask your health care provider which activities are safe for you. ? Eating a healthy diet.  Avoid high-fat foods, like fried foods.  Avoid refined carbohydrates like white bread and white rice.  Limit how much alcohol and caffeine you drink.  Increase your fiber intake. Foods such as fresh fruits, vegetables, beans,   and whole grains are healthy sources of fiber.  Pelvic floor muscle exercises.  Bladder training, such as lengthening the amount of time between bathroom breaks, or using the bathroom at regular intervals.  Using techniques to suppress bladder urges. This can include distraction techniques or controlled breathing exercises.  Medicines to relax the bladder muscles and prevent bladder spasms.  Medicines to help slow or prevent the growth of a man's prostate.  Botox injections. These can help relax the bladder muscles.  Using pulses of electricity to help change bladder reflexes (electrical nerve stimulation).  For women, using a medical device to prevent urine leaks. This is a small, tampon-like, disposable device that is inserted into the urethra.  Injecting collagen or carbon beads (bulking  agents) into the urinary sphincter. These can help thicken tissue and close the bladder opening.  Surgery. Follow these instructions at home: Lifestyle  Limit alcohol and caffeine. These can fill your bladder quickly and irritate it.  Keep yourself clean to help prevent odors and skin damage. Ask your doctor about special skin creams and cleansers that can protect the skin from urine.  Consider wearing pads or adult diapers. Make sure to change them regularly, and always change them right after experiencing incontinence. General instructions  Take over-the-counter and prescription medicines only as told by your health care provider.  Use the bathroom about every 3-4 hours, even if you do not feel the need to urinate. Try to empty your bladder completely every time. After urinating, wait a minute. Then try to urinate again.  Make sure you are in a relaxed position while urinating.  If your incontinence is caused by nerve problems, keep a log of the medicines you take and the times you go to the bathroom.  Keep all follow-up visits as told by your health care provider. This is important. Contact a health care provider if:  You have pain that gets worse.  Your incontinence gets worse. Get help right away if:  You have a fever or chills.  You are unable to urinate.  You have redness in your groin area or down your legs. Summary  Urinary incontinence refers to a condition in which a person is unable to control where and when to pass urine.  This condition may be caused by medicines, infection, weak bladder muscles, weak pelvic floor muscles, enlargement of the prostate (in men), or surgery.  The following factors increase your risk for developing this condition: older age, obesity, pregnancy and childbirth, menopause, neurological diseases, and chronic coughing.  There are several types of urinary incontinence. They include urge incontinence, stress incontinence, overflow  incontinence, and functional incontinence.  This condition is usually treated first with lifestyle and behavioral changes, such as quitting smoking, eating a healthier diet, and doing regular pelvic floor exercises. Other treatment options include medicines, bulking agents, medical devices, electrical nerve stimulation, or surgery. This information is not intended to replace advice given to you by your health care provider. Make sure you discuss any questions you have with your health care provider. Document Revised: 06/05/2017 Document Reviewed: 09/04/2016 Elsevier Patient Education  2020 [employer]  

## 2019-07-22 NOTE — Progress Notes (Signed)
59 y.o. [id number] Married  Caucasian Fe here for annual exam. Menopausal had outline of red on pad and outline of pink on pad 4 weeks ago. She has seen Dr. Loreta Ave for lower abdominal pain and has CT scheduled soon. Recent kidney infection with Dr. Terrilee Files, management. Continues to urinary leakage and plans to have testing with Urology. Wears pads daily for leakage. Drinks caffieinated coffee and tea throughout the day, very little water. Having pain with sexual activity at times, has used coconut oil with good results.No recent Lichen sclerosis flares. Request pap smear yearly. Sees PCP for hypertension and labs. No other health issues today.  No LMP recorded. Patient has had an ablation.          Sexually active: Yes.    The current method of family planning is vasectomy.    Exercising: No.  exercise Smoker:  no  Review of Systems  Constitutional: Negative.        Abdominal pain radiating to back-being evaluated  HENT: Negative.   Eyes: Negative.   Respiratory: Negative.   Cardiovascular: Negative.   Gastrointestinal: Negative.   Genitourinary: Negative.   Musculoskeletal: Negative.   Skin:       Lichen sclerosus  Neurological: Negative.   Endo/Heme/Allergies: Negative.   Psychiatric/Behavioral: Negative.     Health Maintenance: Pap:  05-14-16 neg HPV HR neg, 06-19-17 neg, 07-02-2018 neg HPV HR neg History of Abnormal Pap: no MMG:  05-27-2019 category b density birads 2:neg Self Breast exams: no Colonoscopy: 2014 f/u 54yrs BMD:   none TDaP:  2018 Shingles: no Pneumonia: no Hep C and HIV: both neg 2017 Labs: PCP   reports that she has never smoked. She has never used smokeless tobacco. She reports current alcohol use. She reports that she does not use drugs.  Past Medical History:  Diagnosis Date  . Arthritis   . Endometriosis   . Hypertension   . Lichen sclerosus     Past Surgical History:  Procedure Laterality Date  . BREAST SURGERY Left    breast Bx/ fat necrosis  .  CESAREAN SECTION    . CHOLECYSTECTOMY    . DILATION AND CURETTAGE OF UTERUS    . HYSTEROSCOPY     resection of polyps  . LAPAROSCOPY     endometriosis  . NOVASURE ABLATION  03/26/2009  . REDUCTION MAMMAPLASTY Bilateral 07/28/08   Dr. [name] Contaginasis  . RHINOPLASTY    . VAGINAL DELIVERY     x2    Current Outpatient Medications  Medication Sig Dispense Refill  . betamethasone, augmented, (DIPROLENE) 0.05 % lotion APPLY TO AFFECTED AREA ON THE SCALP TWICE DAILY FOR 2 WEEKS WHEN FLARED, AND THEN USE WHEN NEEDED    . losartan (COZAAR) 25 MG tablet Take 25 mg by mouth daily.    Marland Kitchen [name] 25 MG TB24 tablet TAKE 1 TABLET BY MOUTH EVERY DAY 30 tablet 3  . omeprazole (PRILOSEC) 40 MG capsule as needed.    . triamcinolone cream (KENALOG) 0.1 % Apply 1 application topically 2 (two) times daily.    Marland Kitchen trimethoprim (TRIMPEX) 100 MG tablet Take 1 tablet (100 mg total) by mouth daily. 30 tablet 11   No current facility-administered medications for this visit.    Family History  Problem Relation Age of Onset  . Cancer Mother        bladder and kidney cancer  . Hypertension Father   . Kidney disease Brother   . Rheum arthritis Paternal Grandmother     ROS:  Pertinent items are noted in HPI.  Otherwise, a comprehensive ROS was negative.  Exam:   BP 120/80   Pulse 68   Temp 98.1 F (36.7 C) (Skin)   Resp 16   Ht 5' 6.25" (1.683 m)   Wt 237 lb (107.5 kg)   BMI 37.96 kg/m  Height: 5' 6.25" (168.3 cm) Ht Readings from Last 3 Encounters:  07/22/19 5' 6.25" (1.683 m)  06/20/19 5\' 9"  (1.753 m)  03/07/19 5' 6.26" (1.683 m)    General appearance: alert, cooperative and appears stated age Head: Normocephalic, without obvious abnormality, atraumatic Neck: no adenopathy, supple, symmetrical, trachea midline and thyroid normal to inspection and palpation Lungs: clear to auscultation bilaterally Breasts: normal appearance, no masses or tenderness, No nipple retraction or dimpling, No  nipple discharge or bleeding, No axillary or supraclavicular adenopathy, scarring from reduction bilateral Heart: regular rate and rhythm Abdomen: soft, non-tender; no masses,  no organomegaly Extremities: extremities normal, atraumatic, no cyanosis or edema Skin: Skin color, texture, turgor normal. No rashes or lesions Lymph nodes: Cervical, supraclavicular, and axillary nodes normal. No abnormal inguinal nodes palpated Neurologic: Grossly normal   Pelvic: External genitalia:  no lesions, normal appearance, no Lichen sclerosis flare noted              Urethra:  normal appearing urethra with no masses, tenderness or lesions              Bartholin's and Skene's: normal                 Vagina: normal appearing vagina with normal color and discharge, no lesions              Cervix: no cervical motion tenderness, no lesions and normal appearance, pap collected              Pap taken: Yes.   Bimanual Exam:  Uterus:  normal size, contour, position, consistency, mobility, non-tender and anteverted              Adnexa: normal adnexa and no mass, fullness, tenderness               Rectovaginal: Confirms               Anus:  normal sphincter tone, no lesions  Chaperone present: yes  A:  Well Woman with normal exam  Menopausal no HRT, history of ablation ? PMB vs urinary leakage color  CT scan scheduled per GI  Urinary incontinence with Urology management  History of Lichen Sclerosis, no concerns today  History of hypertension with PCP management  P:   Reviewed health and wellness pertinent to exam  Aware of need to advise if vaginal bleeding occurs. Start using coconut oil to moisturize skin and prevent irritation from incontinence  Continue follow with MD's as indicated  Will advise if has Lichen flare and come in.  Pap smear: yes   counseled on breast self exam, mammography screening, feminine hygiene, osteoporosis, adequate intake of calcium and vitamin D, diet and exercise  return  annually or prn  An After Visit Summary was printed and given to the patient.

## 2019-07-26 ENCOUNTER — Telehealth: Payer: Self-pay | Admitting: *Deleted

## 2019-07-26 DIAGNOSIS — N950 Postmenopausal bleeding: Secondary | ICD-10-CM

## 2019-07-26 LAB — CYTOLOGY - PAP: Diagnosis: NEGATIVE

## 2019-07-26 NOTE — Telephone Encounter (Signed)
Leda Min, RN  07/26/2019 1:19 PM EST    Left message to call Noreene Larsson, RN at [facility] [phone].

## 2019-07-26 NOTE — Telephone Encounter (Signed)
-----   Message from Verner Chol, CNM sent at 07/26/2019  1:15 PM EST ----- Patient was seen for aex and had episode of red stain on pad ? Urine. Discussed with patient concern with PMP. Pap negative. She needs PUS to evaluate for PMP. Dr. Oscar La agrees. She is aware that she may need this. Please schedule

## 2019-07-28 ENCOUNTER — Other Ambulatory Visit: Payer: Self-pay | Admitting: Gastroenterology

## 2019-07-28 DIAGNOSIS — R1033 Periumbilical pain: Secondary | ICD-10-CM

## 2019-08-02 NOTE — Telephone Encounter (Signed)
Spoke with patient. Advised as seen below per Leota Sauers, CNM. Patient agreeable to proceed with PUS, request to schedule on a Thursday. PUS scheduled for 08/18/19 at 3:30pm, consult to follow at 4pm with Dr. Oscar La. Patient declined earlier appts offered due to her work schedule. Order placed for precert. Patient verbalizes understanding and is agreeable.   Routing to provider for final review. Patient is agreeable to disposition. Will close encounter.  Cc: Dr. Oscar La, [facility] [place], [facility]

## 2019-08-02 NOTE — Addendum Note (Signed)
Addended by: Leda Min on: 08/02/2019 10:54 AM   Modules accepted: Orders

## 2019-08-03 ENCOUNTER — Telehealth: Payer: Self-pay | Admitting: Obstetrics and Gynecology

## 2019-08-03 NOTE — Telephone Encounter (Signed)
Call to patient. Per DPR, OK to leave message on voicemail.   Left voicemail requesting a return call to Hayley to review benefits for scheduled Pelvic ultrasound with Jill Jertson, MD 

## 2019-08-05 NOTE — Telephone Encounter (Signed)
Call to patient. Per DPR, OK to leave message on voicemail.   Left voicemail requesting a return call to Hayley to review benefits for scheduled Pelvic ultrasound with Jill Jertson, MD 

## 2019-08-12 ENCOUNTER — Ambulatory Visit
Admission: RE | Admit: 2019-08-12 | Discharge: 2019-08-12 | Disposition: A | Discharge status: Home / Self Care | Payer: 59 | Source: Ambulatory Visit | Attending: Gastroenterology | Admitting: Gastroenterology

## 2019-08-12 DIAGNOSIS — R1033 Periumbilical pain: Secondary | ICD-10-CM

## 2019-08-12 MED ORDER — IOPAMIDOL (ISOVUE-300) INJECTION 61%
100.0000 mL | Freq: Once | INTRAVENOUS | Status: AC | PRN
  Administered 2019-08-12: 09:00:00 100 mL via INTRAVENOUS

## 2019-08-12 NOTE — Telephone Encounter (Signed)
Call to patient regarding benefits for scheduled PUS. Benefits reviewed and patient understands information presented.  Patient stated that Dr. Loreta Ave had her go get an abdomen/pelvis CT with contrast at [facility] this morning. Patient is asking if PUS is necessary since she had CT done.   Routing to Debbi and Dr. Oscar La to review and advise.

## 2019-08-15 NOTE — Telephone Encounter (Signed)
Spoke with patient. Advised per Leota Sauers, CNM. Patient agreeable to proceed with PUS as scheduled with Dr. Oscar La on 08/18/19. Questions answered.   Routing to provider for final review. Patient is agreeable to disposition. Will close encounter.  Cc: Dr. Oscar La, [facility] [place]

## 2019-08-15 NOTE — Telephone Encounter (Signed)
I feel she still needs PUS the CT does not examine endometrial thickness. Report reviewed from CT. Will give to you

## 2019-08-18 ENCOUNTER — Ambulatory Visit (INDEPENDENT_AMBULATORY_CARE_PROVIDER_SITE_OTHER): Payer: 59 | Admitting: Obstetrics and Gynecology

## 2019-08-18 ENCOUNTER — Ambulatory Visit (INDEPENDENT_AMBULATORY_CARE_PROVIDER_SITE_OTHER): Payer: 59

## 2019-08-18 ENCOUNTER — Encounter: Payer: Self-pay | Admitting: Obstetrics and Gynecology

## 2019-08-18 ENCOUNTER — Other Ambulatory Visit: Payer: Self-pay

## 2019-08-18 VITALS — BP 110/76 | HR 64 | Temp 98.1°F | Ht 67.0 in | Wt 238.0 lb

## 2019-08-18 DIAGNOSIS — N3946 Mixed incontinence: Secondary | ICD-10-CM | POA: Diagnosis not present

## 2019-08-18 DIAGNOSIS — N950 Postmenopausal bleeding: Secondary | ICD-10-CM | POA: Diagnosis not present

## 2019-08-18 NOTE — Patient Instructions (Signed)

## 2019-08-18 NOTE — Progress Notes (Signed)
GYNECOLOGY  VISIT   HPI: 59 y.o.   Married White or Caucasian Not Hispanic or Latino  female   [phone] with No LMP recorded. Patient has had an ablation.   here for ultrasound consults     2 months ago she had 2 days within one week where she had a tinge of pink/brown on her pad at the edge of where she had urine leakage. She leaks a large amount every day. Always wears pads. Has bad mixed incontinence. She is seeing Urology, still drinking caffeine. Mybetric has help, she is on an antibiotic prophylaxis for a h/o recurrent UTI.  None since, no blood when she wipes, no UTI at the time of the spotting.   GYNECOLOGIC HISTORY: No LMP recorded. Patient has had an ablation. Contraception:none  Menopausal hormone therapy: none        OB History    Gravida  6   Para  3   Term  3   Preterm  0   AB  3   Living  3     SAB  3   TAB  0   Ectopic  0   Multiple  0   Live Births  3              Patient Active Problem List   Diagnosis Date Noted  . Psoriasis 12/18/2017  . Essential hypertension 10/09/2017  . Overactive bladder 06/25/2017  . Elevated glucose 05/09/2016  . Obesity (BMI 30-39.9) 05/09/2016  . Primary osteoarthritis of right knee 03/06/2016  . Back pain 05/02/2012    Past Medical History:  Diagnosis Date  . Arthritis   . Endometriosis   . Hypertension   . Lichen sclerosus     Past Surgical History:  Procedure Laterality Date  . BREAST SURGERY Left    breast Bx/ fat necrosis  . CESAREAN SECTION    . CHOLECYSTECTOMY    . DILATION AND CURETTAGE OF UTERUS    . HYSTEROSCOPY     resection of polyps  . LAPAROSCOPY     endometriosis  . NOVASURE ABLATION  03/26/2009  . REDUCTION MAMMAPLASTY Bilateral 07/28/08   Dr. [name] Contaginasis  . RHINOPLASTY    . VAGINAL DELIVERY     x2    Current Outpatient Medications  Medication Sig Dispense Refill  . betamethasone, augmented, (DIPROLENE) 0.05 % lotion APPLY TO AFFECTED AREA ON THE SCALP TWICE DAILY  FOR 2 WEEKS WHEN FLARED, AND THEN USE WHEN NEEDED    . losartan (COZAAR) 25 MG tablet Take 25 mg by mouth daily.    Marland Kitchen [name] 25 MG TB24 tablet TAKE 1 TABLET BY MOUTH EVERY DAY 30 tablet 3  . omeprazole (PRILOSEC) 40 MG capsule as needed.    . triamcinolone cream (KENALOG) 0.1 % Apply 1 application topically 2 (two) times daily.    Marland Kitchen trimethoprim (TRIMPEX) 100 MG tablet Take 1 tablet (100 mg total) by mouth daily. 30 tablet 11   No current facility-administered medications for this visit.     ALLERGIES: Sulfa antibiotics and Lisinopril  Family History  Problem Relation Age of Onset  . Cancer Mother        bladder and kidney cancer  . Hypertension Father   . Kidney disease Brother   . Rheum arthritis Paternal Grandmother     Social History   Socioeconomic History  . Marital status: Married    Spouse name: Not on file  . Number of children: Not on file  . Years of  education: Not on file  . Highest education level: Not on file  Occupational History  . Not on file  Tobacco Use  . Smoking status: Never Smoker  . Smokeless tobacco: Never Used  Substance and Sexual Activity  . Alcohol use: Yes    Comment: occ  . Drug use: No  . Sexual activity: Yes    Birth control/protection: Other-see comments    Comment: vasectomy  Other Topics Concern  . Not on file  Social History Narrative  . Not on file   Social Determinants of Health   Financial Resource Strain:   . Difficulty of Paying Living Expenses:   Food Insecurity:   . Worried About Charity fundraiser in the Last Year:   . Arboriculturist in the Last Year:   Transportation Needs:   . Film/video editor (Medical):   Marland Kitchen Lack of Transportation (Non-Medical):   Physical Activity:   . Days of Exercise per Week:   . Minutes of Exercise per Session:   Stress:   . Feeling of Stress :   Social Connections:   . Frequency of Communication with Friends and Family:   . Frequency of Social Gatherings with Friends and  Family:   . Attends Religious Services:   . Active Member of Clubs or Organizations:   . Attends Archivist Meetings:   Marland Kitchen Marital Status:   Intimate Partner Violence:   . Fear of Current or Ex-Partner:   . Emotionally Abused:   Marland Kitchen Physically Abused:   . Sexually Abused:     Review of Systems  All other systems reviewed and are negative.   PHYSICAL EXAMINATION:    There were no vitals taken for this visit.    General appearance: alert, cooperative and appears stated age  Ultrasound images reviewed with the patient. Explained that visualization of her endometrium is difficult given her prior ablation and the position of her uterus.   ASSESSMENT 2 episode of brown/pink staining around her urinary leakage on her pad 2 months ago. Never clear blood from her vagina.  Mixed urinary incontinence, seeing Urology, on myrbetriq    PLAN Discussed limitation in interpreting her ultrasound, suspect endometrial scarring from her prior endometrial ablation. I would not likely be able to get an adequate endometrial biopsy on her. Discussed that endometrial ablation and the scarring it causes can make it harder to diagnose endometrial cancer We discussed the option of hysteroscopy with ultrasound guidance, again may not be able to fully evaluate She will monitor for any further changes on her pad. I gave her some hemoccult cards to test the area in question. Recommended she insert a tampon if trying to determine source of possible bleeding. Also recommended she call with any further episodes of possible bleeding to try and be seen that day. If she has any further questionable bleeding, she will need further evaluation. Discussed cutting back on caffeine, kegel exercises, physical therapy   An After Visit Summary was printed and given to the patient.  After review of the ultrasound, over 20 minutes was spent in patient care, discussing further evaluation and treatment.

## 2019-08-29 ENCOUNTER — Encounter: Payer: Self-pay | Admitting: Certified Nurse Midwife

## 2019-10-24 ENCOUNTER — Other Ambulatory Visit: Payer: Self-pay

## 2019-10-24 ENCOUNTER — Ambulatory Visit (INDEPENDENT_AMBULATORY_CARE_PROVIDER_SITE_OTHER): Payer: 59 | Admitting: Urology

## 2019-10-24 ENCOUNTER — Encounter: Payer: Self-pay | Admitting: Urology

## 2019-10-24 VITALS — BP 138/86 | HR 60

## 2019-10-24 DIAGNOSIS — N3946 Mixed incontinence: Secondary | ICD-10-CM

## 2019-10-24 MED ORDER — TRIMETHOPRIM 100 MG PO TABS: 100.0000 mg | ORAL_TABLET | Freq: Every day | ORAL | 3 refills | Status: DC

## 2019-10-24 NOTE — Progress Notes (Signed)
10/24/2019 8:27 AM   [name] March 14, 1961 [id number]  Referring provider: No referring provider defined for this encounter.  Chief Complaint  Patient presents with  . Follow-up    HPI: [name]:Has had a CT scan and has a left ureterocele assessed by Dr. Erlene Klein in 2019.Has a history of recurrent bladder infections.She failed [name] for mixed incontinence.Recently had a normal renal ultrasound for left flank pain.  Patient leaks with coughing sneezing bending lifting. Primary complaint is urge incontinence. No bedwetting. Wears 2 or 3 pads a day that are damp.  She gets urgency and then reports a slow dribbling flow. She can double void a small amount. She voids 3 or 4 times a day. She gets up once or twice at night  In February she describes pyelonephritis and 2 bladder infections since with burning and not feeling well that respond favorably to antibiotics.   Patient could not cough and had grade 1 hypermobility the bladder neck. Exam a little bit limited due to obesity. She has sub-urethrovaginal swelling. I do not think she has a diverticulum. No stress incontinence  Patient has mixed incontinence. She is currently a partial responder to [name] and failed [name] in the past due to dry mouth. I want to see if her mixed symptoms down regulated on suppression therapy. I mentioned in the future if she still leaking I would order urodynamics.   Reassess patient on trimethoprim suppression therapy and [name] in 8 weeks and order urodynamics if she is not down regulated her incontinence  See some pink or blood on pad that is intermittent.  Has not had a hysterectomy but has had an ablation.  She does not necessarily think it is from her urine but has not 100% sure.  No pain  No hstory of aspirin blood thinner or smoking  In the last years had 2 CT scans one with contrast negative for stone or malignancy.  She has been assessed by Dr. Erlene Klein  noted above.  I did not find any past history of microscopic hematuria though the CT scan was ordered for this  Her ablation was for uterine bleeding 10 years ago  On pelvic examination using a double speculum she had petechiae diffuse throughout the vaginal epithelium.  I did not get a good look at the cervix.  I believe the findings vaginally are benign and represent likely the source of her spotting.  Gynecology referral recommended.  Role of urodynamics for incontinence discussed.  She has an appointment next week with her gynecologist.  She wants to see me on trimethoprim in 4 months.  We might order urodynamics in the future or not  Today Infection free on trimethoprim.  Incontinence stable.  Frequency stable.   PMH: Past Medical History:  Diagnosis Date  . Arthritis   . Endometriosis   . Hypertension   . Lichen sclerosus     Surgical History: Past Surgical History:  Procedure Laterality Date  . BREAST SURGERY Left    breast Bx/ fat necrosis  . CESAREAN SECTION    . CHOLECYSTECTOMY    . DILATION AND CURETTAGE OF UTERUS    . HYSTEROSCOPY     resection of polyps  . LAPAROSCOPY     endometriosis  . NOVASURE ABLATION  03/26/2009  . REDUCTION MAMMAPLASTY Bilateral 07/28/08   Dr. Audrea Muscat Klein  . RHINOPLASTY    . VAGINAL DELIVERY     x2    Home Medications:  Allergies as of 10/24/2019  Reactions   Sulfa Antibiotics Swelling   Lisinopril Other (See Comments)      Medication List       Accurate as of Oct 24, 2019  8:27 AM. If you have any questions, ask your nurse or doctor.        betamethasone (augmented) 0.05 % lotion Commonly known as: DIPROLENE APPLY TO AFFECTED AREA ON THE SCALP TWICE DAILY FOR 2 WEEKS WHEN FLARED, AND THEN USE WHEN NEEDED   losartan 25 MG tablet Commonly known as: COZAAR Take 25 mg by mouth daily.   [name] 25 MG Tb24 tablet Generic drug: mirabegron ER TAKE 1 TABLET BY MOUTH EVERY DAY   omeprazole 40 MG  capsule Commonly known as: PRILOSEC as needed.   omeprazole 10 MG capsule Commonly known as: PRILOSEC Take 10 mg by mouth daily.   triamcinolone cream 0.1 % Commonly known as: KENALOG Apply 1 application topically 2 (two) times daily.   triamcinolone ointment 0.1 % Commonly known as: KENALOG APPLY TO AFFECTED AREAS ON THE EARS TWICE DAILY UNTIL CLEAR   trimethoprim 100 MG tablet Commonly known as: TRIMPEX Take 1 tablet (100 mg total) by mouth daily.       Allergies:  Allergies  Allergen Reactions  . Sulfa Antibiotics Swelling  . Lisinopril Other (See Comments)    Family History: Family History  Problem Relation Age of Onset  . Cancer Mother        bladder and kidney cancer  . Hypertension Father   . Kidney disease Brother   . Rheum arthritis Paternal Grandmother     Social History:  reports that she has never smoked. She has never used smokeless tobacco. She reports current alcohol use. She reports that she does not use drugs.  ROS:                                        Physical Exam: There were no vitals taken for this visit.  Constitutional:  Alert and oriented, No acute distress.  Laboratory Data: Lab Results  Component Value Date   WBC 8.3 07/19/2018   HGB 13.1 07/19/2018   HCT 39.9 07/19/2018   MCV 83.5 07/19/2018   PLT 216 07/19/2018    Lab Results  Component Value Date   CREATININE 0.78 07/19/2018    No results found for: PSA  No results found for: TESTOSTERONE  No results found for: HGBA1C  Urinalysis    Component Value Date/Time   COLORURINE AMBER (A) 07/19/2018 1729   APPEARANCEUR Clear 06/20/2019 1134   LABSPEC 1.017 07/19/2018 1729   PHURINE 6.0 07/19/2018 1729   GLUCOSEU Negative 06/20/2019 1134   HGBUR SMALL (A) 07/19/2018 1729   BILIRUBINUR Negative 06/20/2019 1134   KETONESUR NEGATIVE 07/19/2018 1729   PROTEINUR Negative 06/20/2019 1134   PROTEINUR 30 (A) 07/19/2018 1729   UROBILINOGEN negative  (A) 07/02/2018 0932   UROBILINOGEN 0.2 11/15/2011 0751   NITRITE Negative 06/20/2019 1134   NITRITE POSITIVE (A) 07/19/2018 1729   LEUKOCYTESUR Negative 06/20/2019 1134    Pertinent Imaging:   Assessment & Plan: 90x3 trimethoprim sent.  See in 1 year.  Hold off on urodynamics.  She hopes to lose weight which hopefully would help her incontinence  There are no diagnoses linked to this encounter.  No follow-ups on file.  Martina Sinner, MD  [facility] [address], [address] [place], [place] [address] [phone]

## 2019-11-28 ENCOUNTER — Other Ambulatory Visit: Payer: Self-pay | Admitting: Urology

## 2020-04-03 ENCOUNTER — Other Ambulatory Visit: Payer: Self-pay | Admitting: Urology

## 2020-04-11 ENCOUNTER — Other Ambulatory Visit: Payer: Self-pay | Admitting: Urology

## 2020-07-20 ENCOUNTER — Encounter: Payer: Self-pay | Admitting: Obstetrics & Gynecology

## 2020-07-27 ENCOUNTER — Ambulatory Visit: Payer: 59 | Admitting: Certified Nurse Midwife

## 2020-08-02 ENCOUNTER — Ambulatory Visit: Payer: 59 | Admitting: Obstetrics and Gynecology

## 2020-08-23 ENCOUNTER — Other Ambulatory Visit: Payer: Self-pay | Admitting: *Deleted

## 2020-08-23 MED ORDER — MIRABEGRON ER 25 MG PO TB24: 25.0000 mg | ORAL_TABLET | Freq: Every day | ORAL | 3 refills | Status: DC

## 2020-08-31 ENCOUNTER — Other Ambulatory Visit: Payer: Self-pay

## 2020-08-31 ENCOUNTER — Encounter: Payer: Self-pay | Admitting: Obstetrics and Gynecology

## 2020-08-31 ENCOUNTER — Ambulatory Visit (INDEPENDENT_AMBULATORY_CARE_PROVIDER_SITE_OTHER): Payer: 59 | Admitting: Obstetrics and Gynecology

## 2020-08-31 VITALS — BP 118/68 | HR 64 | Ht 66.5 in | Wt 223.0 lb

## 2020-08-31 DIAGNOSIS — Z01419 Encounter for gynecological examination (general) (routine) without abnormal findings: Secondary | ICD-10-CM | POA: Diagnosis not present

## 2020-08-31 DIAGNOSIS — R140 Abdominal distension (gaseous): Secondary | ICD-10-CM | POA: Insufficient documentation

## 2020-08-31 DIAGNOSIS — K5904 Chronic idiopathic constipation: Secondary | ICD-10-CM | POA: Insufficient documentation

## 2020-08-31 DIAGNOSIS — N3946 Mixed incontinence: Secondary | ICD-10-CM

## 2020-08-31 DIAGNOSIS — K625 Hemorrhage of anus and rectum: Secondary | ICD-10-CM | POA: Insufficient documentation

## 2020-08-31 DIAGNOSIS — K5730 Diverticulosis of large intestine without perforation or abscess without bleeding: Secondary | ICD-10-CM | POA: Insufficient documentation

## 2020-08-31 DIAGNOSIS — R1013 Epigastric pain: Secondary | ICD-10-CM | POA: Insufficient documentation

## 2020-08-31 DIAGNOSIS — Z6835 Body mass index (BMI) 35.0-35.9, adult: Secondary | ICD-10-CM

## 2020-08-31 DIAGNOSIS — R1310 Dysphagia, unspecified: Secondary | ICD-10-CM | POA: Insufficient documentation

## 2020-08-31 NOTE — Progress Notes (Signed)
60 y.o. [id number] Married White or Caucasian Not Hispanic or Latino female here for annual exam. No vaginal bleeding. Sexually active, infrequently. Some entry dyspareunia, controlled with lubrication.   H/O mixed incontinence, managed by Urology. She is on UTI suppression and myrbetic. The urge incontinence is terrible. She is going to be reevaluated.   H/O lichen sclerosis, no issues with use of coconut oil.     No LMP recorded. Patient has had an ablation.          Sexually active: Yes.    The current method of family planning is vasectomy.    Exercising: No.  The patient does not participate in regular exercise at present. Smoker:  no  Health Maintenance: Pap:  07/23/19 Neg  07/02/18 Neg:Neg HR HPV History of abnormal Pap:  no MMG:  2 weeks ago with [facility] BMD:   never Colonoscopy: 03/19/16 FH of colon cancer in father TDaP:  2018 Gardasil: n/a   reports that she has never smoked. She has never used smokeless tobacco. She reports current alcohol use. She reports that she does not use drugs. Occasional ETOH. She owns a nail salon.  Daughter lives in [place]. 2 sons. One son in [place] and has a 85 year old. Other son in [place].   Past Medical History:  Diagnosis Date  . Arthritis   . Endometriosis   . Hypertension   . Lichen sclerosus     Past Surgical History:  Procedure Laterality Date  . BREAST SURGERY Left    breast Bx/ fat necrosis  . CESAREAN SECTION    . CHOLECYSTECTOMY    . DILATION AND CURETTAGE OF UTERUS    . HYSTEROSCOPY     resection of polyps  . LAPAROSCOPY     endometriosis  . NOVASURE ABLATION  03/26/2009  . REDUCTION MAMMAPLASTY Bilateral 07/28/08   Dr. [name] Contaginasis  . RHINOPLASTY    . VAGINAL DELIVERY     x2    Current Outpatient Medications  Medication Sig Dispense Refill  . Azelaic Acid 15 % cream Apply topically.    . betamethasone, augmented, (DIPROLENE) 0.05 % lotion APPLY TO AFFECTED AREA ON THE SCALP TWICE DAILY FOR 2 WEEKS WHEN  FLARED, AND THEN USE WHEN NEEDED    . losartan (COZAAR) 25 MG tablet Take 25 mg by mouth daily.    . mirabegron ER ([name]) 25 MG TB24 tablet Take 1 tablet (25 mg total) by mouth daily. 30 tablet 3  . omeprazole (PRILOSEC) 40 MG capsule as needed.    . trimethoprim (TRIMPEX) 100 MG tablet Take 1 tablet (100 mg total) by mouth daily. 90 tablet 3   No current facility-administered medications for this visit.    Family History  Problem Relation Age of Onset  . Cancer Mother        bladder and kidney cancer  . Hypertension Father   . Kidney disease Brother   . Rheum arthritis Paternal Grandmother     Review of Systems  Constitutional: Negative.   HENT: Negative.   Eyes: Negative.   Respiratory: Negative.   Cardiovascular: Negative.   Gastrointestinal: Negative.   Endocrine: Negative.   Genitourinary: Negative.   Musculoskeletal: Negative.   Skin: Negative.   Allergic/Immunologic: Negative.   Neurological: Negative.   Hematological: Negative.   Psychiatric/Behavioral: Negative.     Exam:   BP 118/68   Pulse 64   Ht 5' 6.5" (1.689 m)   Wt 223 lb (101.2 kg)   BMI 35.45 kg/m  Weight change: @WEIGHTCHANGE @ Height:   Height: 5' 6.5" (168.9 cm)  Ht Readings from Last 3 Encounters:  08/31/20 5' 6.5" (1.689 m)  08/18/19 5\' 7"  (1.702 m)  07/22/19 5' 6.25" (1.683 m)    General appearance: alert, cooperative and appears stated age Head: Normocephalic, without obvious abnormality, atraumatic Neck: no adenopathy, supple, symmetrical, trachea midline and thyroid normal to inspection and palpation Lungs: clear to auscultation bilaterally Cardiovascular: regular rate and rhythm Breasts: normal appearance, no masses or tenderness Abdomen: soft, non-tender; non distended,  no masses,  no organomegaly Extremities: extremities normal, atraumatic, no cyanosis or edema Skin: Skin color, texture, turgor normal. No rashes or lesions Lymph nodes: Cervical, supraclavicular, and axillary  nodes normal. No abnormal inguinal nodes palpated Neurologic: Grossly normal   Pelvic: External genitalia:  no lesions              Urethra:  normal appearing urethra with no masses, tenderness or lesions              Bartholins and Skenes: normal                 Vagina: normal appearing vagina with normal color and discharge, no lesions              Cervix: no lesions               Bimanual Exam:  Uterus:  no masses or tenderness              Adnexa: no mass, fullness, tenderness               Rectovaginal: Confirms               Anus:  normal sphincter tone, no lesions  09/19/19 chaperoned for the exam.  1. Well woman exam Discussed breast self exam Discussed calcium and vit D intake Mammogram just done Colonoscopy due later this year Labs are normal  2. Mixed stress and urge urinary incontinence Followed by Urology  3. BMI 35.0-35.9,adult Discussed eating healthy and exercise

## 2020-08-31 NOTE — Patient Instructions (Signed)

## 2020-09-17 ENCOUNTER — Telehealth: Payer: Self-pay

## 2020-09-17 ENCOUNTER — Ambulatory Visit
Admission: RE | Admit: 2020-09-17 | Discharge: 2020-09-17 | Disposition: A | Discharge status: Home / Self Care | Payer: 59 | Source: Ambulatory Visit | Attending: Urology | Admitting: Urology

## 2020-09-17 ENCOUNTER — Encounter: Payer: Self-pay | Admitting: Urology

## 2020-09-17 ENCOUNTER — Other Ambulatory Visit: Payer: Self-pay

## 2020-09-17 ENCOUNTER — Ambulatory Visit (INDEPENDENT_AMBULATORY_CARE_PROVIDER_SITE_OTHER): Payer: 59 | Admitting: Urology

## 2020-09-17 ENCOUNTER — Ambulatory Visit
Admission: RE | Admit: 2020-09-17 | Discharge: 2020-09-17 | Disposition: A | Discharge status: Home / Self Care | Payer: 59 | Attending: Urology | Admitting: Urology

## 2020-09-17 ENCOUNTER — Other Ambulatory Visit
Admission: RE | Admit: 2020-09-17 | Discharge: 2020-09-17 | Disposition: A | Discharge status: Home / Self Care | Payer: 59 | Source: Home / Self Care | Attending: Urology | Admitting: Urology

## 2020-09-17 VITALS — BP 115/68 | HR 84 | Ht 67.0 in | Wt 208.0 lb

## 2020-09-17 DIAGNOSIS — R109 Unspecified abdominal pain: Secondary | ICD-10-CM

## 2020-09-17 DIAGNOSIS — N390 Urinary tract infection, site not specified: Secondary | ICD-10-CM

## 2020-09-17 DIAGNOSIS — R10A0 Flank pain, unspecified side: Secondary | ICD-10-CM

## 2020-09-17 LAB — URINALYSIS, COMPLETE (UACMP) WITH MICROSCOPIC
Bilirubin Urine: NEGATIVE
Glucose, UA: NEGATIVE mg/dL
Ketones, ur: NEGATIVE mg/dL
Leukocytes,Ua: NEGATIVE
Nitrite: NEGATIVE
Protein, ur: NEGATIVE mg/dL
Specific Gravity, Urine: 1.025 (ref 1.005–1.030)
pH: 5.5 (ref 5.0–8.0)

## 2020-09-17 MED ORDER — AMOXICILLIN-POT CLAVULANATE 875-125 MG PO TABS: 1.0000 | ORAL_TABLET | Freq: Two times a day (BID) | ORAL | 0 refills | Status: DC

## 2020-09-17 NOTE — Progress Notes (Addendum)
09/19/2020  9:25 AM   [name] Sep 15, 1960 [id number]  Referring provider: Romualdo Bolk, MD [address] [place],  [place] [address] Chief Complaint  Patient presents with  . Flank Pain    Follow up   Urological History: 1. OAB - Managed by myrbetriq  2. Mixed incontinence  3. Recurrent UTIs - Managed with daily trimethroprim - Pyelonephritis and 2 bladder infections  HPI: [name] is a 60 y.o. female who presents today for back pain, flank pain, chills, and nausea.  Family history of bladder and kidney cancer, patient's mother. Patient's brother has kidney disease.  Never smoker.  Today patient reported having chills and a fever last night. Patient was prescribed amoxicillin and took that last night for symptoms.   Patient denies dysuria. She reports that there is very little flow when she tries to void. Having an increase in her incontinence episodes and will discuss this further with Dr. Sherron Monday when she follows up with him.  Currently taking myrbetriq and feels like this is not really helping anymore, she will follow this up with Dr. Sherron Monday.   PMH: Past Medical History:  Diagnosis Date  . Arthritis   . Endometriosis   . Hypertension   . Lichen sclerosus     Surgical History: Past Surgical History:  Procedure Laterality Date  . BREAST SURGERY Left    breast Bx/ fat necrosis  . CESAREAN SECTION    . CHOLECYSTECTOMY    . DILATION AND CURETTAGE OF UTERUS    . HYSTEROSCOPY     resection of polyps  . LAPAROSCOPY     endometriosis  . NOVASURE ABLATION  03/26/2009  . REDUCTION MAMMAPLASTY Bilateral 07/28/08   Dr. [name] Contaginasis  . RHINOPLASTY    . VAGINAL DELIVERY     x2    Home Medications:  Allergies as of 09/17/2020      Reactions   Sulfa Antibiotics Swelling   Lisinopril Other (See Comments)      Medication List       Accurate as of September 17, 2020 11:59 PM. If you have any questions, ask your  nurse or doctor.        amoxicillin-clavulanate 875-125 MG tablet Commonly known as: AUGMENTIN SMARTSIG:1 Tablet(s) By Mouth Every 12 Hours What changed: Another medication with the same name was added. Make sure you understand how and when to take each. Changed by: Michiel Cowboy, PA-C   amoxicillin-clavulanate 875-125 MG tablet Commonly known as: AUGMENTIN Take 1 tablet by mouth every 12 (twelve) hours. What changed: You were already taking a medication with the same name, and this prescription was added. Make sure you understand how and when to take each. Changed by: Michiel Cowboy, PA-C   Azelaic Acid 15 % cream Apply topically.   betamethasone (augmented) 0.05 % lotion Commonly known as: DIPROLENE APPLY TO AFFECTED AREA ON THE SCALP TWICE DAILY FOR 2 WEEKS WHEN FLARED, AND THEN USE WHEN NEEDED   losartan 25 MG tablet Commonly known as: COZAAR Take 25 mg by mouth daily.   mirabegron ER 25 MG Tb24 tablet Commonly known as: [name] Take 1 tablet (25 mg total) by mouth daily.   omeprazole 40 MG capsule Commonly known as: PRILOSEC as needed.   trimethoprim 100 MG tablet Commonly known as: TRIMPEX Take 1 tablet (100 mg total) by mouth daily.       Allergies: Allergies  Allergen Reactions  . Sulfa Antibiotics Swelling  . Lisinopril Other (See Comments)  Family History: Family History  Problem Relation Age of Onset  . Cancer Mother        bladder and kidney cancer  . Hypertension Father   . Kidney disease Brother   . Rheum arthritis Paternal Grandmother     Social History:   reports that she has never smoked. She has never used smokeless tobacco. She reports current alcohol use. She reports that she does not use drugs.  ROS: Pertinent ROS in HPI.  Physical Exam: BP 115/68   Pulse 84   Ht 5\' 7"  (1.702 m)   Wt 208 lb (94.3 kg)   BMI 32.58 kg/m   Constitutional:  Alert and oriented, No acute distress. HEENT: [place] AT, moist mucus membranes.   Trachea midline, no masses. Cardiovascular: No clubbing, cyanosis, or edema. Respiratory: Normal respiratory effort, no increased work of breathing. Skin: No rashes, bruises or suspicious lesions. Neurologic: Grossly intact, no focal deficits, moving all 4 extremities. Psychiatric: Normal mood and affect.  Laboratory Data: Results for orders placed or performed during the hospital encounter of 09/17/20  Urinalysis, Complete w Microscopic  Result Value Ref Range   Color, Urine YELLOW YELLOW   APPearance CLEAR CLEAR   Specific Gravity, Urine 1.025 1.005 - 1.030   pH 5.5 5.0 - 8.0   Glucose, UA NEGATIVE NEGATIVE mg/dL   Hgb urine dipstick TRACE (A) NEGATIVE   Bilirubin Urine NEGATIVE NEGATIVE   Ketones, ur NEGATIVE NEGATIVE mg/dL   Protein, ur NEGATIVE NEGATIVE mg/dL   Nitrite NEGATIVE NEGATIVE   Leukocytes,Ua NEGATIVE NEGATIVE   Squamous Epithelial / LPF 6-10 0 - 5   WBC, UA 0-5 0 - 5 WBC/hpf   RBC / HPF 0-5 0 - 5 RBC/hpf   Bacteria, UA FEW (A) NONE SEEN      Lab Results  Component Value Date   CREATININE 0.78 07/19/2018    Urinalysis UCX in progress I have reviewed the labs.  Pertinent Imaging: CLINICAL DATA:  Nephrolithiasis.  EXAM: ABDOMEN - 1 VIEW  COMPARISON:  CT 08/12/2019  FINDINGS: No visualized stones project over the renal beds, course of the ureters, or in the pelvis/bladder. Pelvic phleboliths which were seen on prior CT. Cholecystectomy clips in the right upper quadrant. There is a punctate calcification to the left of L3-L4, lower than the renal shadow and not in the region of the ureter, likely enteric contents. Moderate stool burden. No obstruction. No acute osseous abnormalities.  IMPRESSION: 1. No visualized urolithiasis. 2. Punctate calcification to the left of L3-L4 is likely enteric contents.   Electronically Signed   By: 10/12/2019 M.D.   On: 09/18/2020 21:36   I have personally reviewed the images and agree with  radiologist interpretation.    Assessment & Plan:    1. UTI - Take rest of augmentin 875/125 mg take twice a day for 7 days - Sending in urine for culture - Keep appoointment with Dr. 11/18/2020  2. Flank pain -no stone seen on KUB   Follow Up:  Follow up with Dr. Sherron Monday in May.  I, June, am acting as a Wyn Quaker for Neurosurgeon, [employer].    [facility] [address], [address] [place], [place] [id number]

## 2020-09-17 NOTE — Addendum Note (Signed)
Addended by: Chauncey Cruel F on: 09/17/2020 01:08 PM   Modules accepted: Orders

## 2020-09-17 NOTE — Telephone Encounter (Signed)
Incoming call from pt on triage line who states that she believes she has a kidney infection. She is having, n/v, chills, and flank pain. She is very concerned as she has had UTIs in the past that have progressed to kidney infections. Pt added to schedule today to be seen and assessed.

## 2020-09-19 LAB — URINE CULTURE: Culture: NO GROWTH

## 2020-10-22 ENCOUNTER — Ambulatory Visit (INDEPENDENT_AMBULATORY_CARE_PROVIDER_SITE_OTHER): Payer: 59 | Admitting: Urology

## 2020-10-22 ENCOUNTER — Encounter: Payer: Self-pay | Admitting: Urology

## 2020-10-22 ENCOUNTER — Other Ambulatory Visit: Payer: Self-pay

## 2020-10-22 VITALS — BP 148/96 | HR 62 | Ht 67.0 in | Wt 210.0 lb

## 2020-10-22 DIAGNOSIS — N3946 Mixed incontinence: Secondary | ICD-10-CM

## 2020-10-22 MED ORDER — MIRABEGRON ER 25 MG PO TB24: 25.0000 mg | ORAL_TABLET | Freq: Every day | ORAL | 3 refills | Status: DC

## 2020-10-22 MED ORDER — TRIMETHOPRIM 100 MG PO TABS: 100.0000 mg | ORAL_TABLET | Freq: Every day | ORAL | 3 refills | Status: DC

## 2020-10-22 NOTE — Progress Notes (Signed)
10/22/2020 8:46 AM   [name] 02-May-1961 [id number]  Referring provider: Verner Chol, CNM No address on file  Chief Complaint  Patient presents with  . 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 Apolinar Junes in 2019.Has a history of recurrent bladder infections.She failed [name] for mixed incontinence.Recently had a normal renal ultrasound for left flank pain.  Patient leaks with coughing sneezing bending lifting. Primary complaint is urge incontinence. No bedwetting. Wears 2 or 3 pads a day that are damp.  She gets urgency and then reports a slow dribbling flow. She can double void a small amount. She voids 3 or 4 times a day. She gets up once or twice at night  In February she describes pyelonephritis and 2 bladder infections since with burning and not feeling well that respond favorably to antibiotics.   Patient could not cough and had grade 1 hypermobility the bladder neck. Exam a little bit limited due to obesity. She has sub-urethrovaginalswelling. I do not think she has a diverticulum. No stress incontinence  Patient has mixed incontinence. She is currently a partial responder to [name] and failed [name] in the past due to dry mouth. I want to see if her mixed symptoms down regulated on suppression therapy. I mentioned in the future if she still leaking I would order urodynamics.   Reassess patient on trimethoprim suppression therapy and [name] in 8 weeks and order urodynamics if she is not down regulated her incontinence  In the last years had 2 CT scans one with contrast negative for stone or malignancy. She has been assessed by Dr. Apolinar Junes noted above.I did not find any past history of microscopic hematuria though the CT scan was ordered for this  On pelvic examination using a double speculum she had petechiae diffuse throughout the vaginal epithelium. I did not get a  good look at the cervix.  I believe the findings vaginally are benign and represent likely the source of her spotting. Gynecology referral recommended. Role of urodynamics for incontinence discussed.She has an appointment next week with her gynecologist. She wants to see me on trimethoprim in 4 months. We might order urodynamics in the future or not  Today Infection free on trimethoprim.  Incontinence stable.  Frequency stable. Patient I believe is infection free on the trimethoprim.  There is a question that she might of had 1. She still has urge incontinence and leaks with coughing sneezing.  No bedwetting.  She can leak without awareness.  She can wear 4 liners a day that can be quite wet.  She would like to proceed with urodynamics.  Clinically not infected today.   PMH: Past Medical History:  Diagnosis Date  . Arthritis   . Endometriosis   . Hypertension   . Lichen sclerosus     Surgical History: Past Surgical History:  Procedure Laterality Date  . BREAST SURGERY Left    breast Bx/ fat necrosis  . CESAREAN SECTION    . CHOLECYSTECTOMY    . DILATION AND CURETTAGE OF UTERUS    . HYSTEROSCOPY     resection of polyps  . LAPAROSCOPY     endometriosis  . NOVASURE ABLATION  03/26/2009  . REDUCTION MAMMAPLASTY Bilateral 07/28/08   Dr. [name] Contaginasis  . RHINOPLASTY    . VAGINAL DELIVERY     x2    Home Medications:  Allergies as of 10/22/2020      Reactions  Sulfa Antibiotics Swelling   Lisinopril Other (See Comments)      Medication List       Accurate as of Oct 22, 2020  8:46 AM. If you have any questions, ask your nurse or doctor.        amoxicillin-clavulanate 875-125 MG tablet Commonly known as: AUGMENTIN Take 1 tablet by mouth every 12 (twelve) hours. What changed: Another medication with the same name was removed. Continue taking this medication, and follow the directions you see here. Changed by: Martina Sinner, MD   Azelaic Acid 15 %  gel Apply topically.   betamethasone (augmented) 0.05 % lotion Commonly known as: DIPROLENE APPLY TO AFFECTED AREA ON THE SCALP TWICE DAILY FOR 2 WEEKS WHEN FLARED, AND THEN USE WHEN NEEDED   losartan 25 MG tablet Commonly known as: COZAAR Take 25 mg by mouth daily.   mirabegron ER 25 MG Tb24 tablet Commonly known as: [name] Take 1 tablet (25 mg total) by mouth daily.   omeprazole 40 MG capsule Commonly known as: PRILOSEC as needed.   trimethoprim 100 MG tablet Commonly known as: TRIMPEX Take 1 tablet (100 mg total) by mouth daily.       Allergies:  Allergies  Allergen Reactions  . Sulfa Antibiotics Swelling  . Lisinopril Other (See Comments)    Family History: Family History  Problem Relation Age of Onset  . Cancer Mother        bladder and kidney cancer  . Hypertension Father   . Kidney disease Brother   . Rheum arthritis Paternal Grandmother     Social History:  reports that she has never smoked. She has never used smokeless tobacco. She reports current alcohol use. She reports that she does not use drugs.  ROS:                                        Physical Exam: BP (!) 148/96   Pulse 62   Ht 5\' 7"  (1.702 m)   Wt 95.3 kg   BMI 32.89 kg/m   Constitutional:  Alert and oriented, No acute distress.  Laboratory Data: Lab Results  Component Value Date   WBC 8.3 07/19/2018   HGB 13.1 07/19/2018   HCT 39.9 07/19/2018   MCV 83.5 07/19/2018   PLT 216 07/19/2018    Lab Results  Component Value Date   CREATININE 0.78 07/19/2018    No results found for: PSA  No results found for: TESTOSTERONE  No results found for: HGBA1C  Urinalysis    Component Value Date/Time   COLORURINE YELLOW 09/17/2020 1308   APPEARANCEUR CLEAR 09/17/2020 1308   APPEARANCEUR Clear 06/20/2019 1134   LABSPEC 1.025 09/17/2020 1308   PHURINE 5.5 09/17/2020 1308   GLUCOSEU NEGATIVE 09/17/2020 1308   HGBUR TRACE (A) 09/17/2020 1308    BILIRUBINUR NEGATIVE 09/17/2020 1308   BILIRUBINUR Negative 06/20/2019 1134   KETONESUR NEGATIVE 09/17/2020 1308   PROTEINUR NEGATIVE 09/17/2020 1308   UROBILINOGEN negative (A) 07/02/2018 0932   UROBILINOGEN 0.2 11/15/2011 0751   NITRITE NEGATIVE 09/17/2020 1308   LEUKOCYTESUR NEGATIVE 09/17/2020 1308    Pertinent Imaging:   Assessment & Plan: Urodynamics ordered.  Trimethoprim and [name] 90x3 renewed  There are no diagnoses linked to this encounter.  No follow-ups on file.  11/17/2020, MD  [facility] [address], [address] [place], [place] [phone]

## 2020-12-03 ENCOUNTER — Ambulatory Visit: Payer: Self-pay | Admitting: Urology

## 2020-12-18 ENCOUNTER — Other Ambulatory Visit: Payer: Self-pay | Admitting: Urology

## 2020-12-24 ENCOUNTER — Other Ambulatory Visit: Payer: Self-pay

## 2020-12-24 ENCOUNTER — Ambulatory Visit (INDEPENDENT_AMBULATORY_CARE_PROVIDER_SITE_OTHER): Payer: 59 | Admitting: Urology

## 2020-12-24 VITALS — BP 154/86 | HR 60 | Wt 210.0 lb

## 2020-12-24 DIAGNOSIS — N3946 Mixed incontinence: Secondary | ICD-10-CM

## 2020-12-24 NOTE — Progress Notes (Signed)
12/24/2020 10:22 AM   [name] Aug 01, 1960 [id number]  Referring provider: Romualdo Bolk, MD [address] [place],  [place] [address]  No chief complaint on file.   HPI: [name]: Has had a CT scan and has a left ureterocele assessed by Dr. Apolinar Junes in 2019.  Has a history of recurrent bladder infections.  She failed [name] for mixed incontinence.  Recently had a normal renal ultrasound for left flank pain.   Patient leaks with coughing sneezing bending lifting.  Primary complaint is urge incontinence.  No bedwetting.  Wears 2 or 3 pads a day that are damp.   She gets urgency and then reports a slow dribbling flow.  She can double void a small amount.  She voids 3 or 4 times a day.  She gets up once or twice at night   In February she describes pyelonephritis and 2 bladder infections since with burning and not feeling well that respond favorably to antibiotics.   Patient could not cough and had grade 1 hypermobility the bladder neck.  Exam a little bit limited due to obesity.  She has sub-urethrovaginal swelling.  I do not think she has a diverticulum.  No stress incontinence   Patient has mixed incontinence. She is currently a partial responder to [name] and failed [name] in the past due to dry mouth.  I want to see if her mixed symptoms down regulated on suppression therapy.    Reassess patient on trimethoprim suppression therapy and [name] in 8 weeks and order urodynamics if she is not down regulated her incontinence   In the last years had 2 CT scans one with contrast negative for stone or malignancy.     On pelvic examination using a double speculum she had petechiae diffuse throughout the vaginal epithelium.  I did not get a good look at the cervix.   I believe the findings vaginally are benign and represent likely the source of her spotting.  Gynecology referral recommended.  Role of urodynamics for incontinence discussed.  She has an  appointment next week with her gynecologist.  She wants to see me on trimethoprim in 4 months.     Infection free on trimethoprim.  I Patient I believe is infection free on the trimethoprim.  There is a question that she might of had 1. She still has urge incontinence and leaks with coughing sneezing.  No bedwetting.  She can leak without awareness.  She can wear 4 liners a day that can be quite wet.  She would like to proceed with urodynamics.  Clinically not infected today.  Trimethoprim and [name] 90x3 renewed    Today Frequency stable.  Incontinence stable On urodynamics patient voided 50 mL with a maximum flow of 7 mils per second and emptied efficiently with a prolonged intermittent pattern.  On urodynamics her maximum bladder capacity was 250 mL.  She had impressive bladder overactivity at low bladder volumes.  One of the contractions reached a pressure of 58 cm of water felt there is urgency and she leaked on one of them.  No stress incontinence with Valsalva pressure of 79 cm of water.  During voluntary voiding she voided 50 mL with a maximum flow of 4 mils per second with a prolonged pattern.  Maximum voiding pressure 46 cm of water.  EMG activity decreased during voiding.  Patient had increased bladder sensation.  The details of the urodynamics are signed and dictated  PMH: Past Medical History:  Diagnosis Date  Arthritis    Endometriosis    Hypertension    Lichen sclerosus     Surgical History: Past Surgical History:  Procedure Laterality Date   BREAST SURGERY Left    breast Bx/ fat necrosis   CESAREAN SECTION     CHOLECYSTECTOMY     DILATION AND CURETTAGE OF UTERUS     HYSTEROSCOPY     resection of polyps   LAPAROSCOPY     endometriosis   NOVASURE ABLATION  03/26/2009   REDUCTION MAMMAPLASTY Bilateral 07/28/08   Dr. [name] Contaginasis   RHINOPLASTY     VAGINAL DELIVERY     x2    Home Medications:  Allergies as of 12/24/2020       Reactions   Sulfa  Antibiotics Swelling   Lisinopril Other (See Comments)        Medication List        Accurate as of December 24, 2020 10:22 AM. If you have any questions, ask your nurse or doctor.          amoxicillin-clavulanate 875-125 MG tablet Commonly known as: AUGMENTIN Take 1 tablet by mouth every 12 (twelve) hours.   Azelaic Acid 15 % gel Apply topically.   betamethasone (augmented) 0.05 % lotion Commonly known as: DIPROLENE APPLY TO AFFECTED AREA ON THE SCALP TWICE DAILY FOR 2 WEEKS WHEN FLARED, AND THEN USE WHEN NEEDED   losartan 25 MG tablet Commonly known as: COZAAR Take 25 mg by mouth daily.   mirabegron ER 25 MG Tb24 tablet Commonly known as: [name] Take 1 tablet (25 mg total) by mouth daily.   omeprazole 40 MG capsule Commonly known as: PRILOSEC as needed.   trimethoprim 100 MG tablet Commonly known as: TRIMPEX Take 1 tablet (100 mg total) by mouth daily.        Allergies:  Allergies  Allergen Reactions   Sulfa Antibiotics Swelling   Lisinopril Other (See Comments)    Family History: Family History  Problem Relation Age of Onset   Cancer Mother        bladder and kidney cancer   Hypertension Father    Kidney disease Brother    Rheum arthritis Paternal Grandmother     Social History:  reports that she has never smoked. She has never used smokeless tobacco. She reports current alcohol use. She reports that she does not use drugs.  ROS:                                        Physical Exam: There were no vitals taken for this visit.  Constitutional:  Alert and oriented, No acute distress. HEENT: [place] AT, moist mucus membranes.  Trachea midline, no masses.   Laboratory Data: Lab Results  Component Value Date   WBC 8.3 07/19/2018   HGB 13.1 07/19/2018   HCT 39.9 07/19/2018   MCV 83.5 07/19/2018   PLT 216 07/19/2018    Lab Results  Component Value Date   CREATININE 0.78 07/19/2018    No results found for: PSA  No  results found for: TESTOSTERONE  No results found for: HGBA1C  Urinalysis    Component Value Date/Time   COLORURINE YELLOW 09/17/2020 1308   APPEARANCEUR CLEAR 09/17/2020 1308   APPEARANCEUR Clear 06/20/2019 1134   LABSPEC 1.025 09/17/2020 1308   PHURINE 5.5 09/17/2020 1308   GLUCOSEU NEGATIVE 09/17/2020 1308   HGBUR TRACE (A) 09/17/2020 1308  BILIRUBINUR NEGATIVE 09/17/2020 1308   BILIRUBINUR Negative 06/20/2019 1134   KETONESUR NEGATIVE 09/17/2020 1308   PROTEINUR NEGATIVE 09/17/2020 1308   UROBILINOGEN negative (A) 07/02/2018 0932   UROBILINOGEN 0.2 11/15/2011 0751   NITRITE NEGATIVE 09/17/2020 1308   LEUKOCYTESUR NEGATIVE 09/17/2020 1308    Pertinent Imaging:   Assessment & Plan: Patient has mixed incontinence.  It appears that 70 to 80% of the problem is overactive bladder.  She has mild stress incontinence.  She has failed [name] and [name].  Her leakage without awareness is due to overactivity.  I will see her back on the new beta 3 agonist and I recommended physical therapy.  If this fails we will talk about for refractory treatments.  If she has her stress incontinence treated depending on her goals I would recommend a bulking agent over a sling.  Stop the [name].  She wants to think about physical therapy  There are no diagnoses linked to this encounter.  No follow-ups on file.  Martina Sinner, MD  [facility] [address] [place], [place] [address] [phone]

## 2021-02-04 ENCOUNTER — Ambulatory Visit (INDEPENDENT_AMBULATORY_CARE_PROVIDER_SITE_OTHER): Payer: 59 | Admitting: Urology

## 2021-02-04 ENCOUNTER — Other Ambulatory Visit: Payer: Self-pay

## 2021-02-04 VITALS — BP 144/89 | HR 65 | Wt 210.0 lb

## 2021-02-04 DIAGNOSIS — N3946 Mixed incontinence: Secondary | ICD-10-CM

## 2021-02-04 MED ORDER — GEMTESA 75 MG PO TABS: 75.0000 mg | ORAL_TABLET | Freq: Every day | ORAL | 11 refills | Status: DC

## 2021-02-04 NOTE — Progress Notes (Signed)
02/04/2021 10:55 AM   [name] 04-27-1961 [id number]  Referring provider: Romualdo Bolk, MD [address] [place],  [place] [address]  No chief complaint on file.   HPI: [name]: Has had a CT scan and has a left ureterocele assessed by Dr. Apolinar Klein in 2019.  Has a history of recurrent bladder infections.  She failed [name] for mixed incontinence.  Recently had a normal renal ultrasound for left flank pain.   Patient leaks with coughing sneezing bending lifting.  Primary complaint is urge incontinence.  No bedwetting.  Wears 2 or 3 pads a day that are damp.   She gets urgency and then reports a slow dribbling flow.  She can double void a small amount.  She voids 3 or 4 times a day.  She gets up once or twice at night   In February she describes pyelonephritis and 2 bladder infections since with burning and not feeling well that respond favorably to antibiotics.   Patient could not cough and had grade 1 hypermobility the bladder neck.  Exam a little bit limited due to obesity.  She has sub-urethrovaginal swelling.  I do not think she has a diverticulum.  No stress incontinence   Patient has mixed incontinence. She is currently a partial responder to [name] and failed [name] in the past due to dry mouth.  I want to see if her mixed symptoms down regulated on suppression therapy.    Reassess patient on trimethoprim suppression therapy and [name] in 8 weeks    In the last years had 2 CT scans one with contrast negative for stone or malignancy.     On pelvic examination using a double speculum she had petechiae diffuse throughout the vaginal epithelium.  I did not get a good look at the cervix.   I believe the findings vaginally are benign and represent likely the source of her spotting.  Gynecology referral recommended.  Role of urodynamics for incontinence discussed.  She has an appointment next week with her gynecologist.  She wants to see me on  trimethoprim in 4 months.     Infection free on trimethoprim.  I Patient I believe is infection free on the trimethoprim.  There is a question that she might of had 1. She still has urge incontinence and leaks with coughing sneezing.  No bedwetting.  She can leak without awareness.  She can wear 4 liners a day that can be quite wet.     Trimethoprim and [name] 90x3 renewed     On urodynamics patient voided 50 mL with a maximum flow of 7 mils per second and emptied efficiently with a prolonged intermittent pattern.  On urodynamics her maximum bladder capacity was 250 mL.  She had impressive bladder overactivity at low bladder volumes.  One of the contractions reached a pressure of 58 cm of water felt there is urgency and she leaked on one of them.  No stress incontinence with Valsalva pressure of 79 cm of water.  During voluntary voiding she voided 50 mL with a maximum flow of 4 mils per second with a prolonged pattern.  Maximum voiding pressure 46 cm of water.  EMG activity decreased during voiding.  Patient had increased bladder sensation.    Patient has mixed incontinence.  It appears that 70 to 80% of the problem is overactive bladder.  She has mild stress incontinence.  She has failed [name] and [name].  Her leakage without awareness is due to overactivity.  I will see her back on the new beta 3 agonist and I recommended physical therapy.  If this fails we will talk about for refractory treatments.  If she has her stress incontinence treated depending on her goals I would recommend a bulking agent over a sling.  Stop the [name].  She wants to think about physical therapy  Today The patient is dramatically better on Gemtesa.  She is greater than 80% improved and has minimal incontinence.  She has much less frequency and urgency and her quality life has been significantly improved.  She is clinically not infected.  Frequency is reduced.   PMH: Past Medical History:  Diagnosis Date    Arthritis    Endometriosis    Hypertension    Lichen sclerosus     Surgical History: Past Surgical History:  Procedure Laterality Date   BREAST SURGERY Left    breast Bx/ fat necrosis   CESAREAN SECTION     CHOLECYSTECTOMY     DILATION AND CURETTAGE OF UTERUS     HYSTEROSCOPY     resection of polyps   LAPAROSCOPY     endometriosis   NOVASURE ABLATION  03/26/2009   REDUCTION MAMMAPLASTY Bilateral 07/28/08   Dr. [name] Contaginasis   RHINOPLASTY     VAGINAL DELIVERY     x2    Home Medications:  Allergies as of 02/04/2021       Reactions   Sulfa Antibiotics Swelling   Lisinopril Other (See Comments)        Medication List        Accurate as of February 04, 2021 10:55 AM. If you have any questions, ask your nurse or doctor.          amoxicillin-clavulanate 875-125 MG tablet Commonly known as: AUGMENTIN Take 1 tablet by mouth every 12 (twelve) hours.   Azelaic Acid 15 % gel Apply topically.   betamethasone (augmented) 0.05 % lotion Commonly known as: DIPROLENE APPLY TO AFFECTED AREA ON THE SCALP TWICE DAILY FOR 2 WEEKS WHEN FLARED, AND THEN USE WHEN NEEDED   losartan 25 MG tablet Commonly known as: COZAAR Take 25 mg by mouth daily.   mirabegron ER 25 MG Tb24 tablet Commonly known as: [name] Take 1 tablet (25 mg total) by mouth daily.   omeprazole 40 MG capsule Commonly known as: PRILOSEC as needed.   trimethoprim 100 MG tablet Commonly known as: TRIMPEX Take 1 tablet (100 mg total) by mouth daily.        Allergies:  Allergies  Allergen Reactions   Sulfa Antibiotics Swelling   Lisinopril Other (See Comments)    Family History: Family History  Problem Relation Age of Onset   Cancer Mother        bladder and kidney cancer   Hypertension Father    Kidney disease Brother    Rheum arthritis Paternal Grandmother     Social History:  reports that she has never smoked. She has never used smokeless tobacco. She reports current alcohol  use. She reports that she does not use drugs.  ROS:                                        Physical Exam: Laboratory Data: Lab Results  Component Value Date   WBC 8.3 07/19/2018   HGB 13.1 07/19/2018   HCT 39.9 07/19/2018   MCV 83.5 07/19/2018   PLT 216 07/19/2018  Lab Results  Component Value Date   CREATININE 0.78 07/19/2018    No results found for: PSA  No results found for: TESTOSTERONE  No results found for: HGBA1C  Urinalysis    Component Value Date/Time   COLORURINE YELLOW 09/17/2020 1308   APPEARANCEUR CLEAR 09/17/2020 1308   APPEARANCEUR Clear 06/20/2019 1134   LABSPEC 1.025 09/17/2020 1308   PHURINE 5.5 09/17/2020 1308   GLUCOSEU NEGATIVE 09/17/2020 1308   HGBUR TRACE (A) 09/17/2020 1308   BILIRUBINUR NEGATIVE 09/17/2020 1308   BILIRUBINUR Negative 06/20/2019 1134   KETONESUR NEGATIVE 09/17/2020 1308   PROTEINUR NEGATIVE 09/17/2020 1308   UROBILINOGEN negative (A) 07/02/2018 0932   UROBILINOGEN 0.2 11/15/2011 0751   NITRITE NEGATIVE 09/17/2020 1308   LEUKOCYTESUR NEGATIVE 09/17/2020 1308    Pertinent Imaging:   Assessment & Plan: I recommend for her to have 2 more weeks of samples and prescription of Gemtesa sent.  Without this medication she would need to have a refractory overactive bladder therapy such as Botox or InterStim.  Reassess in 4 months  There are no diagnoses linked to this encounter.  No follow-ups on file.  Martina Sinner, MD  [facility] [address] [place], [place] [address] [phone]

## 2021-03-07 ENCOUNTER — Ambulatory Visit (INDEPENDENT_AMBULATORY_CARE_PROVIDER_SITE_OTHER): Payer: Self-pay | Admitting: Plastic Surgery

## 2021-03-07 ENCOUNTER — Other Ambulatory Visit: Payer: Self-pay

## 2021-03-07 DIAGNOSIS — Z411 Encounter for cosmetic surgery: Secondary | ICD-10-CM

## 2021-03-07 NOTE — Progress Notes (Signed)
Patient presents to discuss Botox treatment.  She has had it once before but its been over a year she cannot remember exactly how many units she had.  She is interested in treatment of the forehead, glabella and crows feet.  On exam she has static and dynamic lines in those areas.  We discussed the risks and benefits of Botox treatment and she is interested in moving forward.  The areas were prepped with an alcohol pad and 40 units of Botox were distributed throughout those areas in a standard injection pattern.  I did do a superior row in the forehead to target the superior frontalis.  She tolerated this well.  We will plan to see her in a couple weeks for any touchups that are necessary and otherwise at her next visit.  She did bring up lip enhancement and we discussed small amount of filler to those areas but she wants to think about that.  All of her questions were answered.

## 2021-05-27 ENCOUNTER — Other Ambulatory Visit: Payer: Self-pay

## 2021-05-27 ENCOUNTER — Ambulatory Visit (INDEPENDENT_AMBULATORY_CARE_PROVIDER_SITE_OTHER): Payer: 59 | Admitting: Urology

## 2021-05-27 VITALS — BP 118/87 | HR 69 | Ht 67.0 in | Wt 210.0 lb

## 2021-05-27 DIAGNOSIS — N3946 Mixed incontinence: Secondary | ICD-10-CM

## 2021-05-27 MED ORDER — TRIMETHOPRIM 100 MG PO TABS: 100.0000 mg | ORAL_TABLET | Freq: Every day | ORAL | 3 refills | Status: DC

## 2021-05-27 NOTE — Progress Notes (Signed)
05/27/2021 9:04 AM   [name] 07-Dec-1960 [id number]  Referring provider: Romualdo Bolk, MD [address] [place],  [place] [address]  Chief Complaint  Patient presents with   Urinary Incontinence    HPI: Reviewed previous note.  Patient dramatically better on Gemtesa.  Continence excellent.  Frequency stable.  No infections.  She will be spending 1 month in [place] with her daughter who lives there   PMH: Past Medical History:  Diagnosis Date   Arthritis    Endometriosis    Hypertension    Lichen sclerosus     Surgical History: Past Surgical History:  Procedure Laterality Date   BREAST SURGERY Left    breast Bx/ fat necrosis   CESAREAN SECTION     CHOLECYSTECTOMY     DILATION AND CURETTAGE OF UTERUS     HYSTEROSCOPY     resection of polyps   LAPAROSCOPY     endometriosis   NOVASURE ABLATION  03/26/2009   REDUCTION MAMMAPLASTY Bilateral 07/28/08   Dr. [name] Contaginasis   RHINOPLASTY     VAGINAL DELIVERY     x2    Home Medications:  Allergies as of 05/27/2021       Reactions   Sulfa Antibiotics Swelling   Lisinopril Other (See Comments)        Medication List        Accurate as of May 27, 2021  9:04 AM. If you have any questions, ask your nurse or doctor.          Gemtesa 75 MG Tabs Generic drug: Vibegron Take 75 mg by mouth daily.   losartan 50 MG tablet Commonly known as: COZAAR Take 50 mg by mouth daily.   omeprazole 40 MG capsule Commonly known as: PRILOSEC as needed.   trimethoprim 100 MG tablet Commonly known as: TRIMPEX Take 1 tablet (100 mg total) by mouth daily.        Allergies:  Allergies  Allergen Reactions   Sulfa Antibiotics Swelling   Lisinopril Other (See Comments)    Family History: Family History  Problem Relation Age of Onset   Cancer Mother        bladder and kidney cancer   Hypertension Father    Kidney disease Brother    Rheum arthritis Paternal Grandmother      Social History:  reports that she has never smoked. She has never used smokeless tobacco. She reports current alcohol use. She reports that she does not use drugs.  ROS:                                        Physical Exam: There were no vitals taken for this visit.  Constitutional:  Alert and oriented, No acute distress. HEENT: [place] AT, moist mucus membranes.  Trachea midline, no masses.  Laboratory Data: Lab Results  Component Value Date   WBC 8.3 07/19/2018   HGB 13.1 07/19/2018   HCT 39.9 07/19/2018   MCV 83.5 07/19/2018   PLT 216 07/19/2018    Lab Results  Component Value Date   CREATININE 0.78 07/19/2018    No results found for: PSA  No results found for: TESTOSTERONE  No results found for: HGBA1C  Urinalysis    Component Value Date/Time   COLORURINE YELLOW 09/17/2020 1308   APPEARANCEUR CLEAR 09/17/2020 1308   APPEARANCEUR Clear 06/20/2019 1134   LABSPEC 1.025 09/17/2020  1308   PHURINE 5.5 09/17/2020 1308   GLUCOSEU NEGATIVE 09/17/2020 1308   HGBUR TRACE (A) 09/17/2020 1308   BILIRUBINUR NEGATIVE 09/17/2020 1308   BILIRUBINUR Negative 06/20/2019 1134   KETONESUR NEGATIVE 09/17/2020 1308   PROTEINUR NEGATIVE 09/17/2020 1308   UROBILINOGEN negative (A) 07/02/2018 0932   UROBILINOGEN 0.2 11/15/2011 0751   NITRITE NEGATIVE 09/17/2020 1308   LEUKOCYTESUR NEGATIVE 09/17/2020 1308    Pertinent Imaging:   Assessment & Plan: 90x3 sent to pharmacy and I will see in a year  There are no diagnoses linked to this encounter.  No follow-ups on file.  Martina Sinner, MD  [facility] [address], [address] [place], [place] [address] [phone]

## 2021-06-17 ENCOUNTER — Ambulatory Visit: Payer: 59 | Admitting: Urology

## 2021-06-26 IMAGING — CT CT ABD-PELV W/ CM
1 of 3 series · 13 of 32 positions shown, 19 images · IV contrast (iopamidol)
Comparison: 07/19/2018

CLINICAL DATA: Periumbilical pain for 1 month

EXAM:
CT ABDOMEN AND PELVIS WITH CONTRAST
TECHNIQUE: Multidetector CT imaging of the abdomen and pelvis was performed
using the standard protocol following bolus administration of
intravenous contrast.
CONTRAST:  100mL CCUVF2-N88 IOPAMIDOL (CCUVF2-N88) INJECTION 61%,
additional oral enteric contrast

[Series 2: abd/pelvis w/cm · axial · 0.98mm/px · z∈[-475,-30]mm · 13 of 103 slices shown, 19 images]
[im 7/103  soft-tissue]
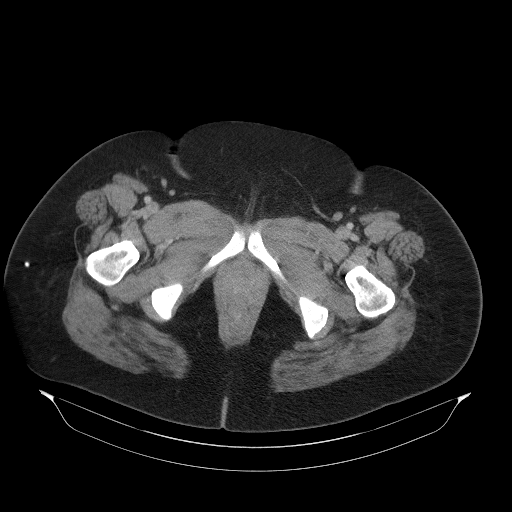
[im 7/103  bone]
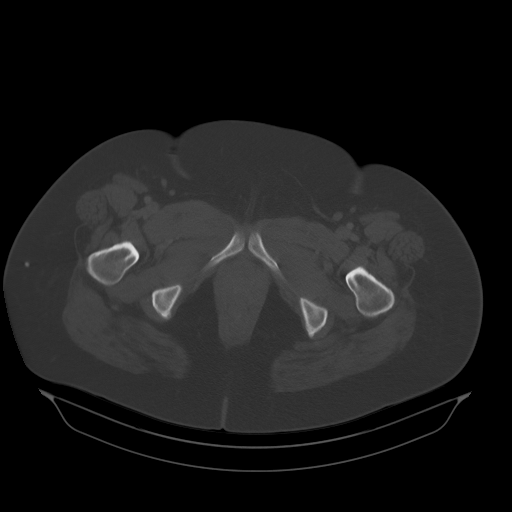
[im 14/103  soft-tissue]
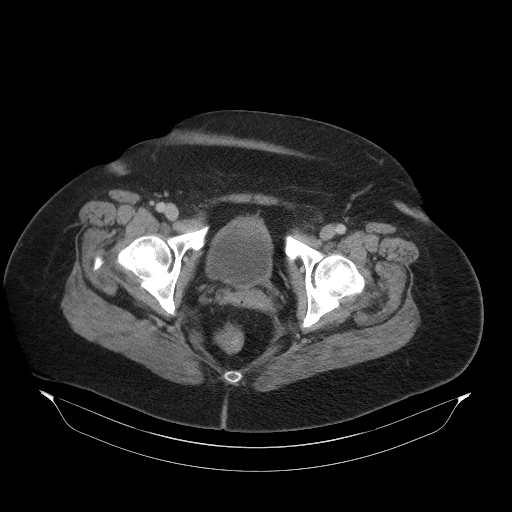
[im 21/103  soft-tissue]
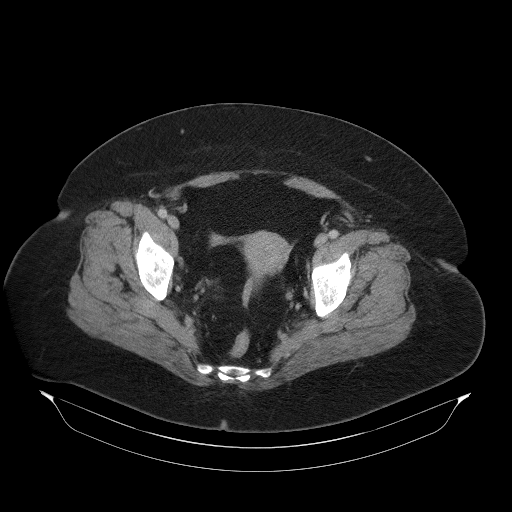
[im 28/103  soft-tissue]
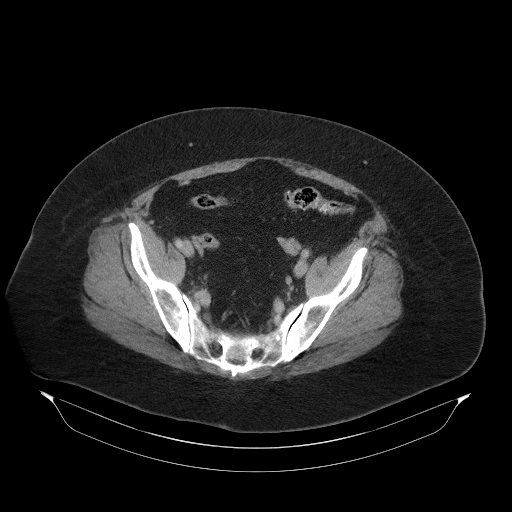
[im 35/103  soft-tissue]
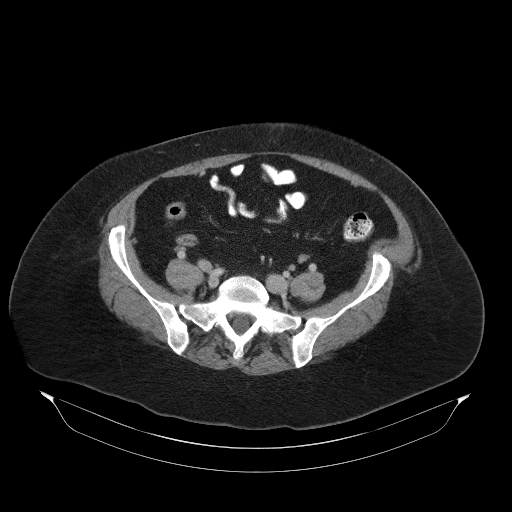
[im 41/103  soft-tissue]
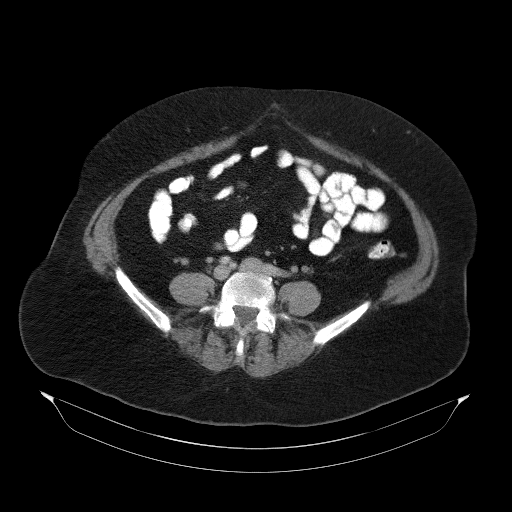
[im 55/103  soft-tissue]
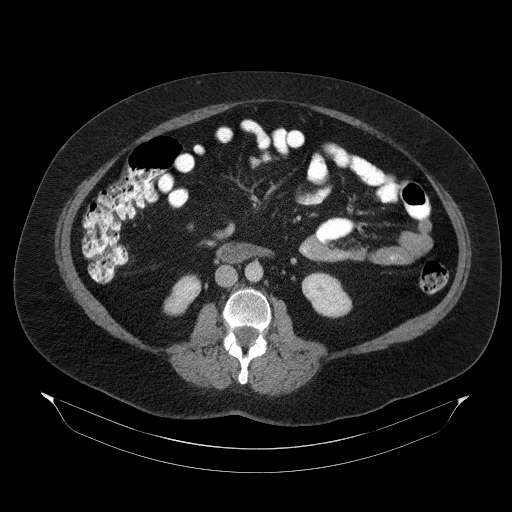
[im 62/103  soft-tissue]
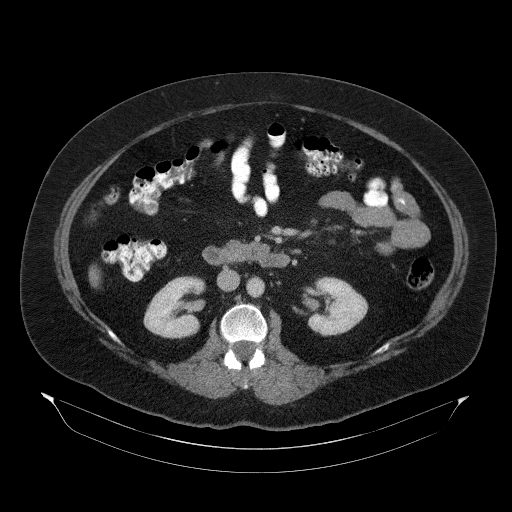
[im 69/103  soft-tissue]
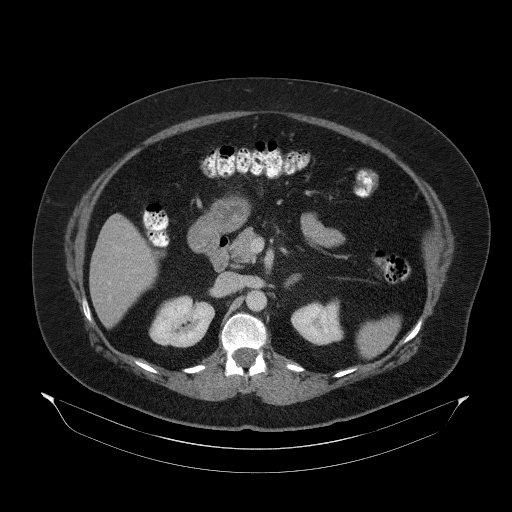
[im 69/103  bone]
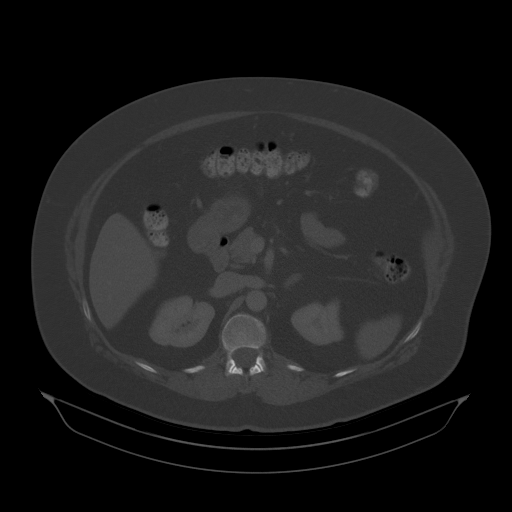
[im 75/103  soft-tissue]
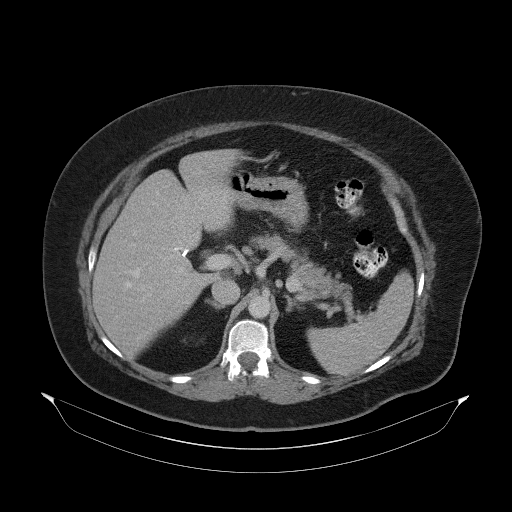
[im 75/103  lung]
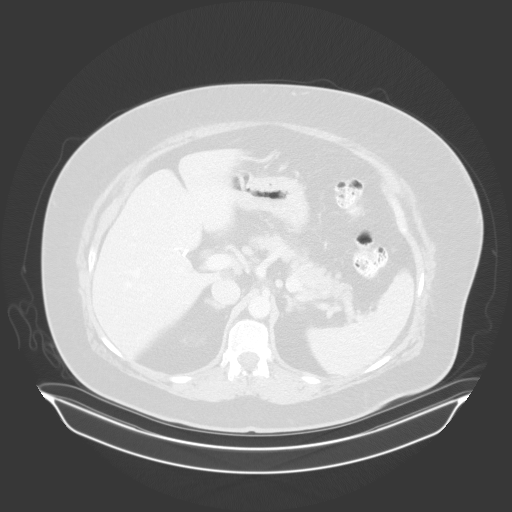
[im 82/103  soft-tissue]
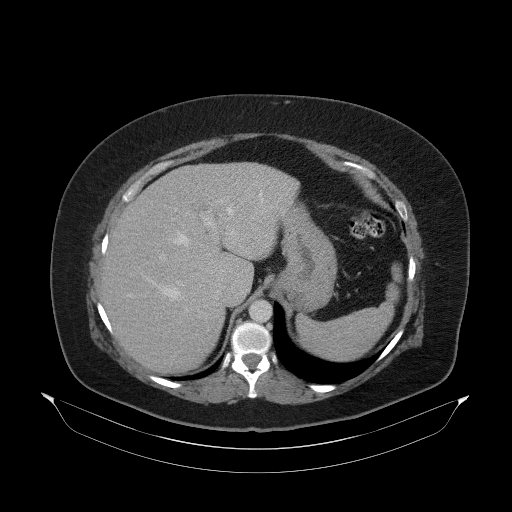
[im 82/103  lung]
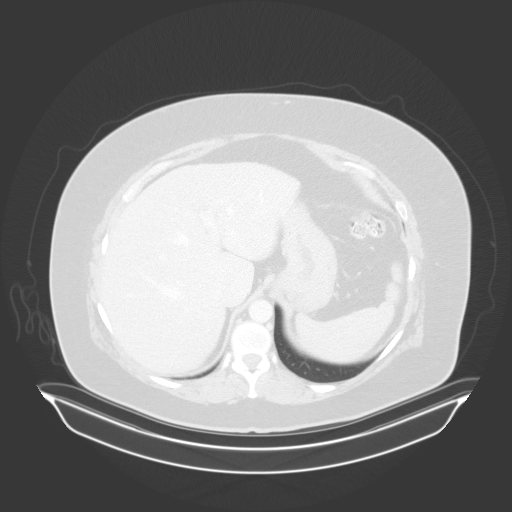
[im 89/103  soft-tissue]
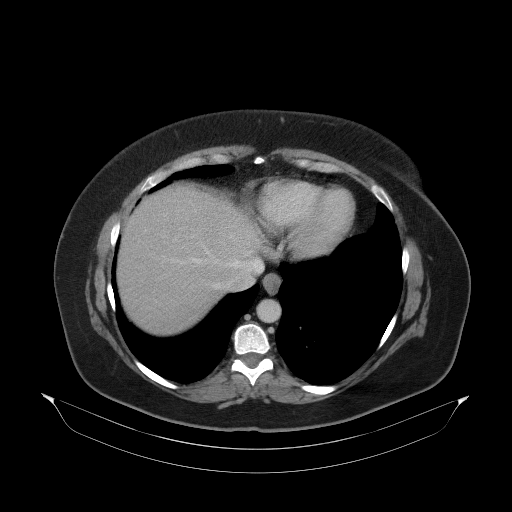
[im 89/103  lung]
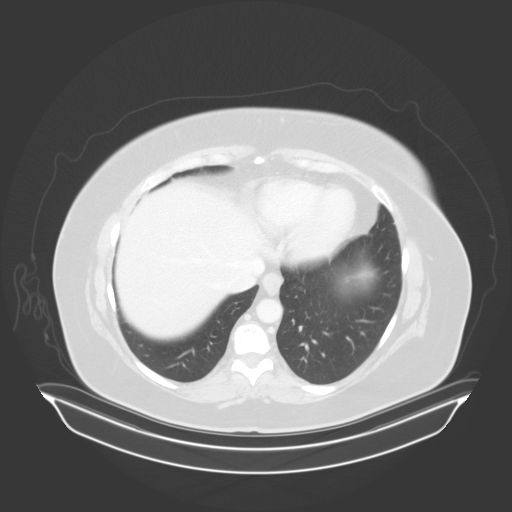
[im 96/103  soft-tissue]
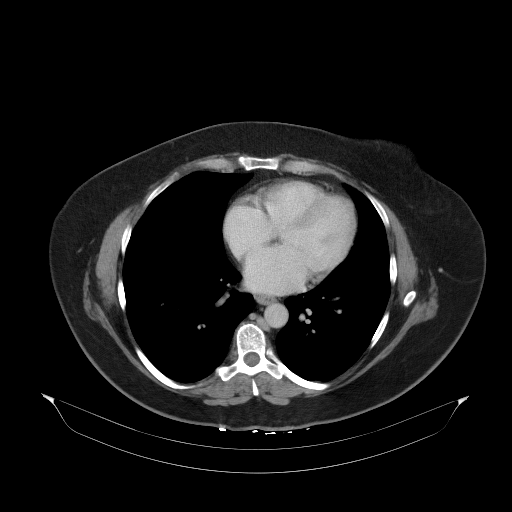
[im 96/103  lung]
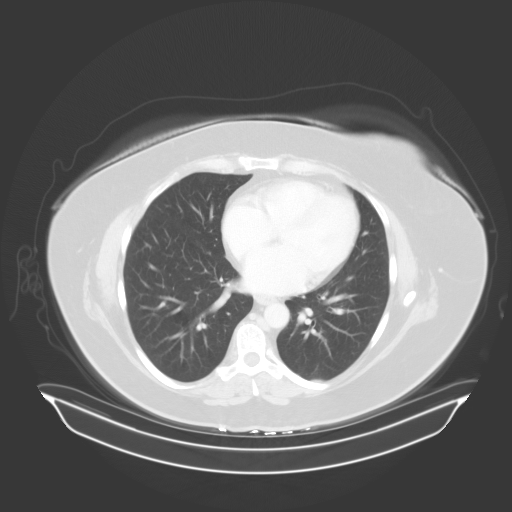

[13 of 32 positions shown; findings below may reference images not displayed]

FINDINGS: Lower chest: No acute abnormality.

Hepatobiliary: No focal liver abnormality is seen. Status post
cholecystectomy. No biliary dilatation.

Pancreas: Unremarkable. No pancreatic ductal dilatation or
surrounding inflammatory changes.

Spleen: Normal in size without significant abnormality.

Adrenals/Urinary Tract: Adrenal glands are unremarkable.
Low-attenuation simple cyst of the left kidney. Kidneys are
otherwise normal, without renal calculi, solid lesion, or
hydronephrosis. Bladder is unremarkable.

Stomach/Bowel: Stomach is within normal limits. Appendix appears
normal. No evidence of bowel wall thickening, distention, or
inflammatory changes.

Vascular/Lymphatic: Scattered aortic atherosclerosis. No enlarged
abdominal or pelvic lymph nodes.

Reproductive: No mass or other significant abnormality.

Other: Small, fat containing umbilical hernia measuring 2.6 cm in
depth with a neck measuring 2.0 cm. No evidence of bowel
involvement. No abdominopelvic ascites.

Musculoskeletal: No acute or significant osseous findings.
IMPRESSION: 1. No acute CT findings of the abdomen or pelvis to explain
periumbilical pain.
2. Small, fat containing umbilical hernia without evidence of bowel
involvement.
3. Status post cholecystectomy.
4. Aortic Atherosclerosis ([id number].A).

## 2021-07-29 ENCOUNTER — Encounter (HOSPITAL_BASED_OUTPATIENT_CLINIC_OR_DEPARTMENT_OTHER): Payer: Self-pay | Admitting: *Deleted

## 2021-09-13 ENCOUNTER — Ambulatory Visit (HOSPITAL_BASED_OUTPATIENT_CLINIC_OR_DEPARTMENT_OTHER): Payer: 59 | Admitting: Obstetrics & Gynecology

## 2021-09-27 ENCOUNTER — Ambulatory Visit (HOSPITAL_BASED_OUTPATIENT_CLINIC_OR_DEPARTMENT_OTHER): Payer: 59 | Admitting: Obstetrics & Gynecology

## 2021-09-27 ENCOUNTER — Encounter (HOSPITAL_BASED_OUTPATIENT_CLINIC_OR_DEPARTMENT_OTHER): Payer: Self-pay | Admitting: Obstetrics & Gynecology

## 2021-09-27 ENCOUNTER — Other Ambulatory Visit (HOSPITAL_COMMUNITY)
Admission: RE | Admit: 2021-09-27 | Discharge: 2021-09-27 | Disposition: A | Discharge status: Home / Self Care | Payer: 59 | Source: Ambulatory Visit | Attending: Obstetrics & Gynecology | Admitting: Obstetrics & Gynecology

## 2021-09-27 VITALS — BP 146/79 | HR 57 | Ht 66.25 in | Wt 224.8 lb

## 2021-09-27 DIAGNOSIS — N950 Postmenopausal bleeding: Secondary | ICD-10-CM

## 2021-09-27 DIAGNOSIS — Z01419 Encounter for gynecological examination (general) (routine) without abnormal findings: Secondary | ICD-10-CM

## 2021-09-27 DIAGNOSIS — Z124 Encounter for screening for malignant neoplasm of cervix: Secondary | ICD-10-CM

## 2021-09-27 DIAGNOSIS — Z800 Family history of malignant neoplasm of digestive organs: Secondary | ICD-10-CM

## 2021-09-27 DIAGNOSIS — N3289 Other specified disorders of bladder: Secondary | ICD-10-CM

## 2021-09-27 DIAGNOSIS — L900 Lichen sclerosus et atrophicus: Secondary | ICD-10-CM | POA: Diagnosis not present

## 2021-09-27 DIAGNOSIS — I10 Essential (primary) hypertension: Secondary | ICD-10-CM

## 2021-09-27 MED ORDER — ESTRADIOL 0.1 MG/GM VA CREA: TOPICAL_CREAM | VAGINAL | 1 refills | Status: DC

## 2021-09-27 NOTE — Progress Notes (Signed)
61 y.o. [id number] Married White or [name] here for annual exam/new patient exam.  Occasionally has some spotting that she sees in a pad.   ? ?H/O lichen sclerosus, biopsy proven.  Using organic olive oil for treatment and this helps with itching.   ? ?Has seen Dr. Matilde Sprang.  Has done renal ultrasound, cystoscopy, and CT scan in 2021.   ? ?Has daughter that lives in [place] who is getting married next year.   ? ?No LMP recorded. Patient has had an ablation.          ?Sexually active: No.  ?The current method of family planning is post menopausal status.    ?Smoker:  no ? ?Health Maintenance: ?Pap:  07/22/2019 Negative ?History of abnormal Pap:  no ?MMG:  07/26/2021 Negative ?Colonoscopy:  2022 with Dr. Collene Mares.  Follow up 5 years.   ?BMD:   plan to do in 2024 ?Screening Labs: does with PCP ? ? reports that she has never smoked. She has never used smokeless tobacco. She reports current alcohol use. She reports that she does not use drugs. ? ?Past Medical History:  ?Diagnosis Date  ? Arthritis   ? Endometriosis   ? Hypertension   ? Lichen sclerosus   ? ? ?Past Surgical History:  ?Procedure Laterality Date  ? BREAST SURGERY Left   ? breast Bx/ fat necrosis  ? CESAREAN SECTION    ? CHOLECYSTECTOMY    ? DILATION AND CURETTAGE OF UTERUS    ? HYSTEROSCOPY    ? resection of polyps  ? LAPAROSCOPY    ? endometriosis  ? NOVASURE ABLATION  03/26/2009  ? REDUCTION MAMMAPLASTY Bilateral 07/28/08  ? Dr. Audrea Muscat Contaginasis  ? RHINOPLASTY    ? VAGINAL DELIVERY    ? x2  ? ? ?Current Outpatient Medications  ?Medication Sig Dispense Refill  ? losartan (COZAAR) 50 MG tablet Take 50 mg by mouth daily.    ? omeprazole (PRILOSEC) 40 MG capsule as needed.    ? triamcinolone ointment (KENALOG) 0.1 % Apply topically 2 (two) times daily.    ? trimethoprim (TRIMPEX) 100 MG tablet Take 1 tablet (100 mg total) by mouth daily. 90 tablet 3  ? TRULICITY 1.5 0000000 SOPN SMARTSIG:0.5 Milliliter(s) SUB-Q Once a Week    ? Vibegron (GEMTESA)  75 MG TABS Take 75 mg by mouth daily. 30 tablet 11  ? clonazePAM (KLONOPIN) 0.5 MG tablet Take 0.5 mg by mouth 2 (two) times daily as needed. (Patient not taking: Reported on 09/27/2021)    ? ?No current facility-administered medications for this visit.  ? ? ?Family History  ?Problem Relation Age of Onset  ? Cancer Mother   ?     bladder and kidney cancer  ? Hypertension Father   ? Kidney disease Brother   ? Rheum arthritis Paternal Grandmother   ? ? ?Review of Systems  ?Genitourinary:   ?     PMP bleeding  ?All other systems reviewed and are negative. ? ?Exam:   ?BP (!) 146/79 (BP Location: Left Arm, Patient Position: Sitting, Cuff Size: Large)   Pulse (!) 57   Ht 5' 6.25" (1.683 m)   Wt 224 lb 12.8 oz (102 kg)   BMI 36.01 kg/m?   Height: 5' 6.25" (168.3 cm) ? ?General appearance: alert, cooperative and appears stated age ?Head: Normocephalic, without obvious abnormality, atraumatic ?Neck: no adenopathy, supple, symmetrical, trachea midline and thyroid normal to inspection and palpation ?Lungs: clear to auscultation bilaterally ?Breasts: normal appearance, no masses or  tenderness ?Heart: regular rate and rhythm ?Abdomen: soft, non-tender; bowel sounds normal; no masses,  no organomegaly ?Extremities: extremities normal, atraumatic, no cyanosis or edema ?Skin: Skin color, texture, turgor normal. No rashes or lesions ?Lymph nodes: Cervical, supraclavicular, and axillary nodes normal. ?No abnormal inguinal nodes palpated ?Neurologic: Grossly normal ? ? ?Pelvic: External genitalia:  no lesions ?             Urethra:  normal appearing urethra with no masses, tenderness or lesions ?             Bartholins and Skenes: normal    ?             Vagina: normal appearing vagina with normal color and no discharge, no lesions ?             Cervix: no lesions ?             Pap taken: Yes.   ?Bimanual Exam:  Uterus:  normal size, contour, position, consistency, mobility, non-tender ?             Adnexa: normal adnexa and no  mass, fullness, tenderness ?              Rectovaginal: Confirms ?              Anus:  normal sphincter tone, no lesions ? ?Chaperone, Octaviano Batty, CMA, was present for exam. ? ?Assessment/Plan: ?1. Well woman exam with routine gynecological exam ?- pap and HR HPV obtained today ?- MMG up to date ?- release of records for last colonoscopy obtained ?- plan BMD with MMG next year ?- vaccines reviewed/updated ? ?2. Cervical cancer screening ?- Cytology - PAP( [place]) ? ?3. Lichen sclerosus ?- treatment discussed ? ?4. Postmenopausal bleeding ?- US PELVIS TRANSVAGINAL NON-OB (TV ONLY); Future ? ?5. Bladder spasms ?- Ambulatory referral to Physical Therapy ? ?6. Essential hypertension ? ?7. Family history of colon cancer ? ?8.  Vaginal atrophy ?- rx for estrace vaginal cream 1 gram pv twice weekly ? ? ?

## 2021-09-30 ENCOUNTER — Encounter (HOSPITAL_BASED_OUTPATIENT_CLINIC_OR_DEPARTMENT_OTHER): Payer: Self-pay | Admitting: *Deleted

## 2021-10-01 LAB — CYTOLOGY - PAP
Comment: NEGATIVE
Diagnosis: NEGATIVE
High risk HPV: NEGATIVE

## 2021-11-01 ENCOUNTER — Ambulatory Visit: Payer: Self-pay | Admitting: Physical Therapy

## 2021-11-06 ENCOUNTER — Other Ambulatory Visit (HOSPITAL_BASED_OUTPATIENT_CLINIC_OR_DEPARTMENT_OTHER): Payer: Self-pay | Admitting: Obstetrics & Gynecology

## 2021-11-06 DIAGNOSIS — N950 Postmenopausal bleeding: Secondary | ICD-10-CM

## 2021-11-06 DIAGNOSIS — R1031 Right lower quadrant pain: Secondary | ICD-10-CM

## 2021-11-08 ENCOUNTER — Ambulatory Visit (HOSPITAL_BASED_OUTPATIENT_CLINIC_OR_DEPARTMENT_OTHER)
Admission: RE | Admit: 2021-11-08 | Discharge: 2021-11-08 | Disposition: A | Discharge status: Home / Self Care | Payer: 59 | Source: Ambulatory Visit | Attending: Obstetrics & Gynecology | Admitting: Obstetrics & Gynecology

## 2021-11-08 DIAGNOSIS — R1031 Right lower quadrant pain: Secondary | ICD-10-CM | POA: Diagnosis present

## 2021-11-08 DIAGNOSIS — N950 Postmenopausal bleeding: Secondary | ICD-10-CM | POA: Insufficient documentation

## 2021-11-22 ENCOUNTER — Ambulatory Visit: Payer: 59 | Admitting: Physician Assistant

## 2021-11-22 VITALS — BP 138/80 | HR 70 | Ht 66.0 in | Wt 224.0 lb

## 2021-11-22 DIAGNOSIS — R102 Pelvic and perineal pain: Secondary | ICD-10-CM | POA: Diagnosis not present

## 2021-11-22 DIAGNOSIS — R8281 Pyuria: Secondary | ICD-10-CM | POA: Diagnosis not present

## 2021-11-22 DIAGNOSIS — R8271 Bacteriuria: Secondary | ICD-10-CM | POA: Diagnosis not present

## 2021-11-22 DIAGNOSIS — N390 Urinary tract infection, site not specified: Secondary | ICD-10-CM

## 2021-11-22 LAB — MICROSCOPIC EXAMINATION

## 2021-11-22 LAB — URINALYSIS, COMPLETE
Bilirubin, UA: NEGATIVE
Glucose, UA: NEGATIVE
Ketones, UA: NEGATIVE
Nitrite, UA: NEGATIVE
Protein,UA: NEGATIVE
RBC, UA: NEGATIVE
Specific Gravity, UA: 1.03 (ref 1.005–1.030)
Urobilinogen, Ur: 0.2 mg/dL (ref 0.2–1.0)
pH, UA: 5.5 (ref 5.0–7.5)

## 2021-11-22 MED ORDER — NITROFURANTOIN MONOHYD MACRO 100 MG PO CAPS: 100.0000 mg | ORAL_CAPSULE | Freq: Two times a day (BID) | ORAL | 0 refills | Status: AC

## 2021-11-22 NOTE — Patient Instructions (Signed)
I'm prescribing Macrobid today to treat you for a UTI. Please stop daily trimethoprim while on this medication; you may resume trimethoprim the day after you complete Macrobid.

## 2021-11-22 NOTE — Progress Notes (Unsigned)
11/22/2021 2:16 PM   [name] [birth year] [id number]  CC: Chief Complaint  Patient presents with   Abdominal Pain   HPI: [name] is a 61 y.o. female with PMH microscopic hematuria with a family history of urothelial carcinoma with benign hematuria workup in 2019, recurrent UTI on daily trimethoprim, OAB with mixed incontinence on Gemtesa, and left ureterocele who presents today for evaluation of possible UTI.   Today she reports an approximate 3-week history of suprapubic burning and tenderness. She denies fever, chills, nausea, and vomiting. She recently underwent pelvic US for evaluation of her discomfort with no significant findings. She is concerned that her discomfort may represent something more serious than a UTI and states she just "feels like there's something there."  In-office UA today positive for trace leukocyte esterase; urine microscopy with 6-10 WBCs/HPF and many bacteria.  PMH: Past Medical History:  Diagnosis Date   Arthritis    Endometriosis    Hypertension    Lichen sclerosus     Surgical History: Past Surgical History:  Procedure Laterality Date   BREAST SURGERY Left    breast Bx/ fat necrosis   CESAREAN SECTION     CHOLECYSTECTOMY     DILATION AND CURETTAGE OF UTERUS     HYSTEROSCOPY     resection of polyps   LAPAROSCOPY     endometriosis   NOVASURE ABLATION  03/26/2009   REDUCTION MAMMAPLASTY Bilateral 07/28/08   Dr. [name] Contaginasis   RHINOPLASTY     VAGINAL DELIVERY     x2    Home Medications:  Allergies as of 11/22/2021       Reactions   Sulfa Antibiotics Swelling   Lisinopril Other (See Comments)        Medication List        Accurate as of November 22, 2021  2:16 PM. If you have any questions, ask your nurse or doctor.          clonazePAM 0.5 MG tablet Commonly known as: KLONOPIN Take 0.5 mg by mouth 2 (two) times daily as needed.   estradiol 0.1 MG/GM vaginal cream Commonly known as: ESTRACE 1 gram  vaginally twice weekly   Gemtesa 75 MG Tabs Generic drug: Vibegron Take 75 mg by mouth daily.   losartan 50 MG tablet Commonly known as: COZAAR Take 50 mg by mouth daily.   omeprazole 40 MG capsule Commonly known as: PRILOSEC as needed.   triamcinolone ointment 0.1 % Commonly known as: KENALOG Apply topically 2 (two) times daily.   trimethoprim 100 MG tablet Commonly known as: TRIMPEX Take 1 tablet (100 mg total) by mouth daily.   Trulicity 1.5 MG/0.5ML Sopn Generic drug: Dulaglutide SMARTSIG:0.5 Milliliter(s) SUB-Q Once a Week        Allergies:  Allergies  Allergen Reactions   Sulfa Antibiotics Swelling   Lisinopril Other (See Comments)    Family History: Family History  Problem Relation Age of Onset   Cancer Mother        bladder and kidney cancer   Hypertension Father    Kidney disease Brother    Rheum arthritis Paternal Grandmother     Social History:   reports that she has never smoked. She has never used smokeless tobacco. She reports current alcohol use. She reports that she does not use drugs.  Physical Exam: There were no vitals taken for this visit.  Constitutional:  Alert and oriented, no acute distress, nontoxic appearing HEENT: [place], AT Cardiovascular: No clubbing, cyanosis, or  edema Respiratory: Normal respiratory effort, no increased work of breathing Skin: No rashes, bruises or suspicious lesions Neurologic: Grossly intact, no focal deficits, moving all 4 extremities Psychiatric: Normal mood and affect  Laboratory Data: Results for orders placed or performed in visit on 09/27/21  Cytology - PAP( [place])  Result Value Ref Range   High risk HPV Negative    Adequacy      Satisfactory for evaluation. The presence or absence of an   Adequacy      endocervical/transformation zone component cannot be determined because   Adequacy of atrophy.    Diagnosis      - Negative for intraepithelial lesion or malignancy (NILM)   Comment  Inflammation and atrophic changes are present.    Comment Normal Reference Range HPV - Negative    Assessment & Plan:   1. Recurrent UTI UA today with bacteriuria and pyuria despite daily ppx. Suspect breakthrough UTI. Will treat with empiric Macrobid and send for standard and atypical cultures. If her discomfort persists despite culture appropriate treatment, ok to schedule cystoscopy, though we discussed that she has no hematuria today and my clinical suspicion is low for urologic malignancy at this time. - Urinalysis, Complete - CULTURE, URINE COMPREHENSIVE - nitrofurantoin, macrocrystal-monohydrate, (MACROBID) 100 MG capsule; Take 1 capsule (100 mg total) by mouth 2 (two) times daily for 5 days.  Dispense: 10 capsule; Refill: 0 - Mycoplasma / ureaplasma culture   Return if symptoms worsen or fail to improve.  Carman Ching, PA-C  [facility] [address], [address] [place], [place] [address] [phone]

## 2021-11-25 LAB — CULTURE, URINE COMPREHENSIVE

## 2021-11-28 LAB — MYCOPLASMA / UREAPLASMA CULTURE
Mycoplasma hominis Culture: NEGATIVE
Ureaplasma urealyticum: NEGATIVE

## 2021-11-29 ENCOUNTER — Other Ambulatory Visit: Payer: Self-pay | Admitting: Family Medicine

## 2021-11-29 MED ORDER — CEFUROXIME AXETIL 250 MG PO TABS: 250.0000 mg | ORAL_TABLET | Freq: Two times a day (BID) | ORAL | 0 refills | Status: DC

## 2021-12-06 ENCOUNTER — Ambulatory Visit: Payer: 59 | Admitting: Physician Assistant

## 2021-12-06 VITALS — BP 128/84 | HR 71

## 2021-12-06 DIAGNOSIS — R102 Pelvic and perineal pain: Secondary | ICD-10-CM | POA: Diagnosis not present

## 2021-12-06 LAB — MICROSCOPIC EXAMINATION: Bacteria, UA: NONE SEEN

## 2021-12-06 LAB — URINALYSIS, COMPLETE
Bilirubin, UA: NEGATIVE
Glucose, UA: NEGATIVE
Ketones, UA: NEGATIVE
Leukocytes,UA: NEGATIVE
Nitrite, UA: NEGATIVE
Protein,UA: NEGATIVE
RBC, UA: NEGATIVE
Specific Gravity, UA: 1.025 (ref 1.005–1.030)
Urobilinogen, Ur: 0.2 mg/dL (ref 0.2–1.0)
pH, UA: 5 (ref 5.0–7.5)

## 2021-12-06 NOTE — Progress Notes (Unsigned)
12/06/2021 4:13 PM   [name] 1960/12/15 [id number]  CC: Chief Complaint  Patient presents with   Recurrent UTI   HPI: [name] is a 61 y.o. 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   I saw her in clinic most recently 14 days ago for possible breakthrough UTI.  UA was notable for pyuria and bacteriuria at that time.  I treated her with empiric Macrobid and her urine culture finalized with ampicillin and Bactrim resistant E. coli.  I switched her to Ceftin for persistent symptoms.  Today she reports she completed both courses of antibiotics.  Her dysuria has improved but not resolved.  She continues to have frequency and urgency over baseline.  She is mostly bothered by lower abdominal/pelvic pain and is unsure if this is urinary in etiology.  Recent pelvic ultrasound with no significant findings.  In-office UA and microscopy today pan negative.  PMH: Past Medical History:  Diagnosis Date   Arthritis    Endometriosis    Hypertension    Lichen sclerosus     Surgical History: Past Surgical History:  Procedure Laterality Date   BREAST SURGERY Left    breast Bx/ fat necrosis   CESAREAN SECTION     CHOLECYSTECTOMY     DILATION AND CURETTAGE OF UTERUS     HYSTEROSCOPY     resection of polyps   LAPAROSCOPY     endometriosis   NOVASURE ABLATION  03/26/2009   REDUCTION MAMMAPLASTY Bilateral 07/28/08   Dr. [name] Contaginasis   RHINOPLASTY     VAGINAL DELIVERY     x2    Home Medications:  Allergies as of 12/06/2021       Reactions   Sulfa Antibiotics Swelling   Lisinopril Other (See Comments)        Medication List        Accurate as of December 06, 2021  4:13 PM. If you have any questions, ask your nurse or doctor.           STOP taking these medications    cefUROXime 250 MG tablet Commonly known as: CEFTIN       TAKE these medications    estradiol 0.1 MG/GM vaginal cream Commonly known as: ESTRACE 1 gram vaginally twice weekly   Gemtesa 75 MG Tabs Generic drug: Vibegron Take 75 mg by mouth daily.   losartan 50 MG tablet Commonly known as: COZAAR Take 50 mg by mouth daily.   omeprazole 40 MG capsule Commonly known as: PRILOSEC as needed.   triamcinolone ointment 0.1 % Commonly known as: KENALOG Apply topically 2 (two) times daily.   trimethoprim 100 MG tablet Commonly known as: TRIMPEX Take 1 tablet (100 mg total) by mouth daily.        Allergies:  Allergies  Allergen Reactions   Sulfa Antibiotics Swelling   Lisinopril Other (See Comments)    Family History: Family History  Problem Relation Age of Onset   Cancer Mother        bladder and kidney cancer   Hypertension Father    Kidney disease Brother    Rheum arthritis Paternal Grandmother     Social History:   reports that she has never smoked. She has never used smokeless tobacco. She reports current alcohol use. She reports that she does not  use drugs.  Physical Exam: BP 128/84 (BP Location: Left Arm, Patient Position: Sitting, Cuff Size: Large)   Pulse 71   Constitutional:  Alert and oriented, no acute distress, nontoxic appearing HEENT: [place], AT Cardiovascular: No clubbing, cyanosis, or edema Respiratory: Normal respiratory effort, no increased work of breathing Skin: No rashes, bruises or suspicious lesions Neurologic: Grossly intact, no focal deficits, moving all 4 extremities Psychiatric: Normal mood and affect  Laboratory Data: Results for orders placed or performed in visit on 12/06/21  Microscopic Examination   Urine  Result Value Ref Range   WBC, UA 0-5 0 - 5 /hpf   RBC, Urine 0-2 0 - 2 /hpf   Epithelial Cells (non renal) 0-10 0 - 10 /hpf   Bacteria, UA None seen None seen/Few  Urinalysis, Complete   Result Value Ref Range   Specific Gravity, UA 1.025 1.005 - 1.030   pH, UA 5.0 5.0 - 7.5   Color, UA Yellow Yellow   Appearance Ur Clear Clear   Leukocytes,UA Negative Negative   Protein,UA Negative Negative/Trace   Glucose, UA Negative Negative   Ketones, UA Negative Negative   RBC, UA Negative Negative   Bilirubin, UA Negative Negative   Urobilinogen, Ur 0.2 0.2 - 1.0 mg/dL   Nitrite, UA Negative Negative   Microscopic Examination See below:    Assessment & Plan:   1. Pelvic pain We discussed that her persistent urgency and frequency may be a normal finding following recent breakthrough infection.  Her UA is bland today and I am not concerned for persistent UTI.  I explained that I continue to have low concern for urinary malignancy at that time, however given her history of micro heme (not recently noted) and family history of urothelial carcinoma, it would not be inappropriate to pursue cystoscopy to rule this out as well.  She is in agreement with this plan. - Urinalysis, Complete  Return in about 3 weeks (around 12/27/2021) for cysto .  Carman Ching, PA-C  [facility] [address] [place], [place] [address] [phone]

## 2021-12-06 NOTE — Patient Instructions (Signed)

## 2021-12-12 NOTE — Therapy (Deleted)
OUTPATIENT PHYSICAL THERAPY FEMALE PELVIC EVALUATION   Patient Name: [name] MRN: [id number] DOB:[birth year], 61 y.o., female Today's Date: 12/12/2021    Past Medical History:  Diagnosis Date   Arthritis    Endometriosis    Hypertension    Lichen sclerosus    Past Surgical History:  Procedure Laterality Date   BREAST SURGERY Left    breast Bx/ fat necrosis   CESAREAN SECTION     CHOLECYSTECTOMY     DILATION AND CURETTAGE OF UTERUS     HYSTEROSCOPY     resection of polyps   LAPAROSCOPY     endometriosis   NOVASURE ABLATION  03/26/2009   REDUCTION MAMMAPLASTY Bilateral 07/28/08   Dr. [name] Contaginasis   RHINOPLASTY     VAGINAL DELIVERY     x2   Patient Active Problem List   Diagnosis Date Noted   Chronic idiopathic constipation 08/31/2020   Abdominal bloating 08/31/2020   Diverticulosis of colon 08/31/2020   Dysphagia 08/31/2020   Epigastric pain 08/31/2020   Rectal bleeding 08/31/2020   Psoriasis 12/18/2017   Essential hypertension 10/09/2017   Overactive bladder 06/25/2017   Elevated glucose 05/09/2016   Obesity (BMI 30-39.9) 05/09/2016   Primary osteoarthritis of right knee 03/06/2016   Back pain 05/02/2012    PCP: Clovia Cuff, PA-C  REFERRING PROVIDER: Jerene Bears, MD  REFERRING DIAG: N32.89 (ICD-10-CM) - Bladder spasms  THERAPY DIAG:  No diagnosis found.  Rationale for Evaluation and Treatment Rehabilitation  ONSET DATE: ***  SUBJECTIVE:                                                                                                                                                                                           SUBJECTIVE STATEMENT: *** Fluid intake: {Yes/No:304960894}    PAIN:  Are you having pain? {yes/no:20286} NPRS scale: ***/10 Pain location: {pelvic pain location:27098}  Pain type: {type:313116} Pain description: {PAIN DESCRIPTION:21022940}   Aggravating factors: *** Relieving factors:  ***  PRECAUTIONS: None  WEIGHT BEARING RESTRICTIONS {Yes ***/No:24003}  FALLS:  Has patient fallen in last 6 months? {fallsyesno:27318}  LIVING ENVIRONMENT: Lives with: {[facility] lives with:25569::"lives with their family"} Lives in: {Lives in:25570} Stairs: {opstairs:27293} Has following equipment at home: {Assistive devices:23999}  OCCUPATION: ***  PLOF: {PLOF:24004}  PATIENT GOALS ***  PERTINENT HISTORY:  Endometriosis; Lichen Sclerosus; C-section Sexual abuse: {Yes/No:304960894}  BOWEL MOVEMENT Pain with bowel movement: {yes/no:20286} Type of bowel movement:{PT BM type:27100} Fully empty rectum: {Yes/No:304960894} Leakage: {Yes/No:304960894} Pads: {Yes/No:304960894} Fiber supplement: {Yes/No:304960894}  URINATION Pain with urination: {yes/no:20286} Fully empty bladder: {Yes/No:304960894} Stream: {PT urination:27102} Urgency: {Yes/No:304960894} Frequency: *** Leakage: {PT leakage:27103} Pads: {  YPP/JK:[id number]}  INTERCOURSE Pain with intercourse: {pain with intercourse PA:27099} Ability to have vaginal penetration:  {Yes/No:304960894} Climax: *** Marinoff Scale: ***/3  PREGNANCY Vaginal deliveries 2 Tearing {Yes***/No:304960894} C-section deliveries 1 Currently pregnant {Yes***/No:304960894}  PROLAPSE {PT prolapse:27101}    OBJECTIVE:   DIAGNOSTIC FINDINGS:  ***  PATIENT SURVEYS:  {rehab surveys:24030}  PFIQ-7 ***  COGNITION:  Overall cognitive status: {cognition:24006}     SENSATION:  Light touch: {intact/deficits:24005}  Proprioception: {intact/deficits:24005}  MUSCLE LENGTH: Hamstrings: Right *** deg; Left *** deg Thomas test: Right *** deg; Left *** deg  LUMBAR SPECIAL TESTS:  {lumbar special test:25242}  FUNCTIONAL TESTS:  {Functional tests:24029}  GAIT: Distance walked: *** Assistive device utilized: {Assistive devices:23999} Level of assistance: {Levels of assistance:24026} Comments: ***               POSTURE:  {posture:25561}   PELVIC ALIGNMENT:  LUMBARAROM/PROM  A/PROM A/PROM  eval  Flexion   Extension   Right lateral flexion   Left lateral flexion   Right rotation   Left rotation    (Blank rows = not tested)  LOWER EXTREMITY ROM:  {AROM/PROM:27142} ROM Right eval Left eval  Hip flexion    Hip extension    Hip abduction    Hip adduction    Hip internal rotation    Hip external rotation    Knee flexion    Knee extension    Ankle dorsiflexion    Ankle plantarflexion    Ankle inversion    Ankle eversion     (Blank rows = not tested)  LOWER EXTREMITY MMT:  MMT Right eval Left eval  Hip flexion    Hip extension    Hip abduction    Hip adduction    Hip internal rotation    Hip external rotation    Knee flexion    Knee extension    Ankle dorsiflexion    Ankle plantarflexion    Ankle inversion    Ankle eversion      PALPATION:   General  ***                External Perineal Exam ***                             Internal Pelvic Floor ***  Patient confirms identification and approves PT to assess internal pelvic floor and treatment {yes/no:20286}  PELVIC MMT:   MMT eval  Vaginal   Internal Anal Sphincter   External Anal Sphincter   Puborectalis   Diastasis Recti   (Blank rows = not tested)        TONE: ***  PROLAPSE: ***  TODAY'S TREATMENT  EVAL ***   PATIENT EDUCATION:  Education details: *** Person educated: {Person educated:25204} Education method: {Education Method:25205} Education comprehension: {Education Comprehension:25206}   HOME EXERCISE PROGRAM: ***  ASSESSMENT:  CLINICAL IMPRESSION: Patient is a *** y.o. *** who was seen today for physical therapy evaluation and treatment for ***.    OBJECTIVE IMPAIRMENTS {opptimpairments:25111}.   ACTIVITY LIMITATIONS {activitylimitations:27494}  PARTICIPATION LIMITATIONS: {participationrestrictions:25113}  PERSONAL FACTORS {Personal factors:25162} are also affecting patient's  functional outcome.   REHAB POTENTIAL: {rehabpotential:25112}  CLINICAL DECISION MAKING: {clinical decision making:25114}  EVALUATION COMPLEXITY: {Evaluation complexity:25115}   GOALS: Goals reviewed with patient? {yes/no:20286}  SHORT TERM GOALS: Target date: {follow up:25551}  *** Baseline: Goal status: {GOALSTATUS:25110}  2.  *** Baseline:  Goal status: {GOALSTATUS:25110}  3.  *** Baseline:  Goal status: {GOALSTATUS:25110}  4.  ***  Baseline:  Goal status: {GOALSTATUS:25110}  5.  *** Baseline:  Goal status: {GOALSTATUS:25110}  6.  *** Baseline:  Goal status: {GOALSTATUS:25110}  LONG TERM GOALS: Target date: {follow up:25551}   *** Baseline:  Goal status: {GOALSTATUS:25110}  2.  *** Baseline:  Goal status: {GOALSTATUS:25110}  3.  *** Baseline:  Goal status: {GOALSTATUS:25110}  4.  *** Baseline:  Goal status: {GOALSTATUS:25110}  5.  *** Baseline:  Goal status: {GOALSTATUS:25110}  6.  *** Baseline:  Goal status: {GOALSTATUS:25110}  PLAN: PT FREQUENCY: {rehab frequency:25116}  PT DURATION: {rehab duration:25117}  PLANNED INTERVENTIONS: {rehab planned interventions:25118::"Therapeutic exercises","Therapeutic activity","Neuromuscular re-education","Balance training","Gait training","Patient/Family education","Joint mobilization"}  PLAN FOR NEXT SESSION: ***   Kamar Callender, PT 12/12/2021, 8:27 AM

## 2021-12-13 ENCOUNTER — Ambulatory Visit: Payer: 59 | Admitting: Physical Therapy

## 2021-12-23 ENCOUNTER — Other Ambulatory Visit: Payer: Self-pay

## 2021-12-23 DIAGNOSIS — N390 Urinary tract infection, site not specified: Secondary | ICD-10-CM

## 2021-12-25 ENCOUNTER — Ambulatory Visit (HOSPITAL_BASED_OUTPATIENT_CLINIC_OR_DEPARTMENT_OTHER): Payer: 59 | Admitting: Obstetrics & Gynecology

## 2021-12-25 ENCOUNTER — Other Ambulatory Visit (HOSPITAL_BASED_OUTPATIENT_CLINIC_OR_DEPARTMENT_OTHER): Payer: 59

## 2021-12-25 ENCOUNTER — Other Ambulatory Visit: Payer: 59 | Admitting: Urology

## 2021-12-25 ENCOUNTER — Encounter (HOSPITAL_BASED_OUTPATIENT_CLINIC_OR_DEPARTMENT_OTHER): Payer: Self-pay

## 2021-12-26 NOTE — Progress Notes (Signed)
   12/27/21  CC: No chief complaint on file.   HPI: [name] is a 61 y.o.female with a personal history of microscopic hematuria with a family history of urothelial carcinoma with benign hematuria work-up in 2019, recurrent UTI on daily trimethoprim, OAB with mixed incontinence on Gemtesa and left ureterocele who presents today for a diagnostic cystoscopy.   She underwent a CT urogram on 03/05/2018 which was personally reviewed today.  This indicated only a small incidental left ureterocele, otherwise unremarkable without any GU pathology.    Vitals:   12/27/21 1104  BP: 124/79  Pulse: 61   NED. A&Ox3.   No respiratory distress   Abd soft, NT, ND Normal external genitalia with patent urethral meatus  Cystoscopy Procedure Note  Patient identification was confirmed, informed consent was obtained, and patient was prepped using Betadine solution.  Lidocaine jelly was administered per urethral meatus.    Procedure: - Flexible cystoscope introduced, without any difficulty.   - Thorough search of the bladder revealed:    normal urethral meatus Mild inflmamtion on trigone    normal urothelium    no stones    no ulcers     no tumors    no urethral polyps    no trabeculation Left ureterocele appreciated, peristaltic with pinpoint opening which does E flux of urine rhythmically  Post-Procedure: - Patient tolerated the procedure well  Assessment/ Plan:  Ureterocele  - Appreciated on cysto today  - Asymptomatic - No history of pyelonephritis, obstruction, or stones of the ureterocele  2. History of microscopic hematuria  - Cytoscopy unremarkable today   3. OAB -Currently on Gemtesa; Followed by Dr Sherron Monday  - She wonders if she can come off Gemtesa we discussed a 1 month holiday from this. Keep follow-up with Dr Sherron Monday 05/2022.    Tawni Millers as a Neurosurgeon for Vanna Scotland, MD.,have documented all relevant documentation on the behalf of Vanna Scotland, MD,as directed by  Vanna Scotland, MD while in the presence of Vanna Scotland, MD.  I have reviewed the above documentation for accuracy and completeness, and I agree with the above.   Vanna Scotland, MD

## 2021-12-27 ENCOUNTER — Ambulatory Visit: Payer: 59 | Admitting: Urology

## 2021-12-27 ENCOUNTER — Other Ambulatory Visit
Admission: RE | Admit: 2021-12-27 | Discharge: 2021-12-27 | Disposition: A | Discharge status: Home / Self Care | Payer: 59 | Attending: Urology | Admitting: Urology

## 2021-12-27 VITALS — BP 124/79 | HR 61 | Ht 66.1 in | Wt 224.0 lb

## 2021-12-27 DIAGNOSIS — N390 Urinary tract infection, site not specified: Secondary | ICD-10-CM | POA: Diagnosis present

## 2021-12-27 DIAGNOSIS — R102 Pelvic and perineal pain: Secondary | ICD-10-CM

## 2021-12-27 DIAGNOSIS — N3946 Mixed incontinence: Secondary | ICD-10-CM

## 2021-12-27 DIAGNOSIS — N3289 Other specified disorders of bladder: Secondary | ICD-10-CM

## 2021-12-27 DIAGNOSIS — N3281 Overactive bladder: Secondary | ICD-10-CM | POA: Diagnosis not present

## 2021-12-27 LAB — URINALYSIS, COMPLETE (UACMP) WITH MICROSCOPIC
Bilirubin Urine: NEGATIVE
Glucose, UA: NEGATIVE mg/dL
Hgb urine dipstick: NEGATIVE
Ketones, ur: NEGATIVE mg/dL
Nitrite: NEGATIVE
Protein, ur: NEGATIVE mg/dL
RBC / HPF: NONE SEEN RBC/hpf (ref 0–5)
Specific Gravity, Urine: 1.015 (ref 1.005–1.030)
pH: 5.5 (ref 5.0–8.0)

## 2022-05-26 ENCOUNTER — Ambulatory Visit: Payer: 59 | Admitting: Urology

## 2022-07-03 ENCOUNTER — Telehealth: Payer: Self-pay | Admitting: Urology

## 2022-07-03 NOTE — Telephone Encounter (Signed)
Pt calling asking for samples of gemtesa from Dr. Matilde Sprang. Pt last seen by Macdiarmid 05/2021. Pt states she's been sick and traveling. Pt last OV was 12/2021 with Dr. Erlene Quan, per OV  "3. OAB -Currently on Gemtesa; Followed by Dr Matilde Sprang  - She wonders if she can come off Gemtesa we discussed a 1 month holiday from this. Keep follow-up with Dr Matilde Sprang 05/2022."  Pt either canceled or noshowed last appts, last rx filled by macdiarmid was trimpex 12/22 and gemtesa  02/04/21 both refilled for 1 year.  Pt did schedule appt for 2/24, ok to give samples? Pt promised she would not cancel appt.

## 2022-07-03 NOTE — Telephone Encounter (Signed)
Pt has appt scheduled w/MacDiarmid on 2/19 and needs a refill for Gemtesa.  She only has 1 left and wanted to know if she could get some samples to last til appt.  She's afraid her mail order pharmacy has closed down and wanted to confirm.

## 2022-07-07 ENCOUNTER — Ambulatory Visit: Payer: 59 | Admitting: Urology

## 2022-07-07 NOTE — Telephone Encounter (Signed)
.  left message to have patient return my call.  Left message that samples will be left at front desk.

## 2022-07-28 ENCOUNTER — Ambulatory Visit: Payer: 59 | Admitting: Urology

## 2022-07-28 ENCOUNTER — Encounter: Payer: Self-pay | Admitting: Urology

## 2022-07-28 VITALS — BP 131/86 | HR 74 | Ht 67.0 in | Wt 195.0 lb

## 2022-07-28 DIAGNOSIS — N3946 Mixed incontinence: Secondary | ICD-10-CM | POA: Diagnosis not present

## 2022-07-28 LAB — MICROSCOPIC EXAMINATION

## 2022-07-28 LAB — URINALYSIS, COMPLETE
Bilirubin, UA: NEGATIVE
Glucose, UA: NEGATIVE
Ketones, UA: NEGATIVE
Nitrite, UA: NEGATIVE
Protein,UA: NEGATIVE
RBC, UA: NEGATIVE
Specific Gravity, UA: 1.01 (ref 1.005–1.030)
Urobilinogen, Ur: 0.2 mg/dL (ref 0.2–1.0)
pH, UA: 5 (ref 5.0–7.5)

## 2022-07-28 MED ORDER — TRIMETHOPRIM 100 MG PO TABS: 100.0000 mg | ORAL_TABLET | Freq: Every day | ORAL | 3 refills | Status: DC

## 2022-07-28 MED ORDER — GEMTESA 75 MG PO TABS: 75.0000 mg | ORAL_TABLET | Freq: Every day | ORAL | 3 refills | Status: DC

## 2022-07-28 NOTE — Progress Notes (Signed)
07/28/2022 3:07 PM   [name] 01-04-1961 [id number]  Referring provider: Ladoris Gene, PA-C [employer],  [place] [address]  No chief complaint on file.   HPI: Reviewed previous note.  Patient dramatically better on Gemtesa.  Continence excellent.  Frequency stable.  No infections.  She will be spending 1 month in [place] with her daughter who lives there  Today Frequency stable.  Gemtesa helping urgency incontinence.  Infection free on trimethoprim.  She is going to [place] again for a wedding of her daughter.  Clinically not infected.        PMH: Past Medical History:  Diagnosis Date   Arthritis    Endometriosis    Hypertension    Lichen sclerosus     Surgical History: Past Surgical History:  Procedure Laterality Date   BREAST SURGERY Left    breast Bx/ fat necrosis   CESAREAN SECTION     CHOLECYSTECTOMY     DILATION AND CURETTAGE OF UTERUS     HYSTEROSCOPY     resection of polyps   LAPAROSCOPY     endometriosis   NOVASURE ABLATION  03/26/2009   REDUCTION MAMMAPLASTY Bilateral 07/28/08   Dr. Audrea Muscat Contaginasis   RHINOPLASTY     VAGINAL DELIVERY     x2    Home Medications:  Allergies as of 07/28/2022       Reactions   Sulfa Antibiotics Swelling   Lisinopril Other (See Comments)        Medication List        Accurate as of July 28, 2022  3:07 PM. If you have any questions, ask your nurse or doctor.          estradiol 0.1 MG/GM vaginal cream Commonly known as: ESTRACE 1 gram vaginally twice weekly   Gemtesa 75 MG Tabs Generic drug: Vibegron Take 75 mg by mouth daily.   losartan 50 MG tablet Commonly known as: COZAAR Take 50 mg by mouth daily.   omeprazole 40 MG capsule Commonly known as: PRILOSEC as needed.   triamcinolone ointment 0.1 % Commonly known as: KENALOG Apply topically 2 (two) times daily.   trimethoprim 100 MG tablet Commonly known as: TRIMPEX Take 1 tablet (100 mg total) by mouth  daily.        Allergies:  Allergies  Allergen Reactions   Sulfa Antibiotics Swelling   Lisinopril Other (See Comments)    Family History: Family History  Problem Relation Age of Onset   Cancer Mother        bladder and kidney cancer   Hypertension Father    Kidney disease Brother    Rheum arthritis Paternal Grandmother     Social History:  reports that she has never smoked. She has never used smokeless tobacco. She reports current alcohol use. She reports that she does not use drugs.  ROS:                                        Physical Exam: There were no vitals taken for this visit.  Constitutional:  Alert and oriented, No acute distress. HEENT:  AT, moist mucus membranes.  Trachea midline, no masses.  Laboratory Data: Lab Results  Component Value Date   WBC 8.3 07/19/2018   HGB 13.1 07/19/2018   HCT 39.9 07/19/2018   MCV 83.5 07/19/2018   PLT 216 07/19/2018    Lab Results  Component Value Date   CREATININE 0.78 07/19/2018    No results found for: "PSA"  No results found for: "TESTOSTERONE"  No results found for: "HGBA1C"  Urinalysis    Component Value Date/Time   COLORURINE YELLOW 12/27/2021 [place] 12/27/2021 1042   APPEARANCEUR Clear 12/06/2021 1330   LABSPEC 1.015 12/27/2021 1042   PHURINE 5.5 12/27/2021 1042   GLUCOSEU NEGATIVE 12/27/2021 1042   HGBUR NEGATIVE 12/27/2021 1042   BILIRUBINUR NEGATIVE 12/27/2021 1042   BILIRUBINUR Negative 12/06/2021 1330   KETONESUR NEGATIVE 12/27/2021 1042   PROTEINUR NEGATIVE 12/27/2021 1042   UROBILINOGEN negative (A) 07/02/2018 0932   UROBILINOGEN 0.2 11/15/2011 0751   NITRITE NEGATIVE 12/27/2021 1042   LEUKOCYTESUR TRACE (A) 12/27/2021 1042    Pertinent Imaging:   Assessment & Plan: Prescriptions renewed 90 x 3 and I will see in a year  1. Mixed incontinence  - Urinalysis, Complete   No follow-ups on file.  Reece Packer, MD  [facility] [address] [place], [place] [address] [phone]

## 2022-08-04 ENCOUNTER — Other Ambulatory Visit: Payer: Self-pay | Admitting: Orthopedic Surgery

## 2022-08-04 DIAGNOSIS — M25552 Pain in left hip: Secondary | ICD-10-CM

## 2022-08-04 DIAGNOSIS — M25462 Effusion, left knee: Secondary | ICD-10-CM

## 2022-08-15 ENCOUNTER — Ambulatory Visit
Admission: RE | Admit: 2022-08-15 | Discharge: 2022-08-15 | Disposition: A | Discharge status: Home / Self Care | Payer: 59 | Source: Ambulatory Visit | Attending: Orthopedic Surgery | Admitting: Orthopedic Surgery

## 2022-08-15 DIAGNOSIS — M25562 Pain in left knee: Secondary | ICD-10-CM | POA: Diagnosis present

## 2022-08-15 DIAGNOSIS — M25552 Pain in left hip: Secondary | ICD-10-CM | POA: Diagnosis present

## 2022-08-15 DIAGNOSIS — M25462 Effusion, left knee: Secondary | ICD-10-CM | POA: Diagnosis present

## 2022-08-20 ENCOUNTER — Other Ambulatory Visit: Payer: Self-pay | Admitting: Radiology

## 2022-09-19 ENCOUNTER — Other Ambulatory Visit: Payer: Self-pay | Admitting: Surgery

## 2022-09-19 DIAGNOSIS — N6091 Unspecified benign mammary dysplasia of right breast: Secondary | ICD-10-CM

## 2022-10-03 ENCOUNTER — Ambulatory Visit (HOSPITAL_BASED_OUTPATIENT_CLINIC_OR_DEPARTMENT_OTHER): Payer: 59 | Admitting: Obstetrics & Gynecology

## 2022-11-08 ENCOUNTER — Ambulatory Visit
Admission: EM | Admit: 2022-11-08 | Discharge: 2022-11-08 | Disposition: A | Discharge status: Home / Self Care | Payer: 59 | Attending: Emergency Medicine | Admitting: Emergency Medicine

## 2022-11-08 DIAGNOSIS — R42 Dizziness and giddiness: Secondary | ICD-10-CM

## 2022-11-08 DIAGNOSIS — R079 Chest pain, unspecified: Secondary | ICD-10-CM | POA: Diagnosis not present

## 2022-11-08 DIAGNOSIS — I10 Essential (primary) hypertension: Secondary | ICD-10-CM

## 2022-11-08 LAB — POCT FASTING CBG KUC MANUAL ENTRY: POCT Glucose (KUC): 120 mg/dL — AB (ref 70–99)

## 2022-11-08 NOTE — ED Provider Notes (Signed)
Renaldo Fiddler    CSN: [id number] Arrival date & time: 11/08/22  0934      History   Chief Complaint Chief Complaint  Patient presents with   Dizziness   Chest Pain    HPI [name] is a 62 y.o. female.  Patient presents with midsternal chest "pressure" since she woke up this morning.  She had some dizziness, blurred vision, and shakiness yesterday.  The chest pain radiates to her back.  No aggravating or alleviating factors noted.  She denies shortness of breath, nausea, vomiting, diaphoresis, focal weakness, numbness, or other symptoms.  No OTC medications taken today.  Her medical history includes hypertension.  The history is provided by the patient and medical records.    Past Medical History:  Diagnosis Date   Arthritis    Endometriosis    Hypertension    Lichen sclerosus     Patient Active Problem List   Diagnosis Date Noted   Chronic idiopathic constipation 08/31/2020   Abdominal bloating 08/31/2020   Diverticulosis of colon 08/31/2020   Dysphagia 08/31/2020   Epigastric pain 08/31/2020   Rectal bleeding 08/31/2020   Psoriasis 12/18/2017   Essential hypertension 10/09/2017   Overactive bladder 06/25/2017   Elevated glucose 05/09/2016   Obesity (BMI 30-39.9) 05/09/2016   Primary osteoarthritis of right knee 03/06/2016   Back pain 05/02/2012    Past Surgical History:  Procedure Laterality Date   BREAST SURGERY Left    breast Bx/ fat necrosis   CESAREAN SECTION     CHOLECYSTECTOMY     DILATION AND CURETTAGE OF UTERUS     HYSTEROSCOPY     resection of polyps   LAPAROSCOPY     endometriosis   NOVASURE ABLATION  03/26/2009   REDUCTION MAMMAPLASTY Bilateral 07/28/08   Dr. [name] Contaginasis   RHINOPLASTY     VAGINAL DELIVERY     x2    OB History     Gravida  6   Para  3   Term  3   Preterm  0   AB  3   Living  3      SAB  3   IAB  0   Ectopic  0   Multiple  0   Live Births  3            Home Medications     Prior to Admission medications   Medication Sig Start Date End Date Taking? Authorizing Provider  estradiol (ESTRACE) 0.1 MG/GM vaginal cream 1 gram vaginally twice weekly 09/27/21   Jerene Bears, MD  losartan (COZAAR) 50 MG tablet Take 50 mg by mouth daily. 01/25/21   [provider]  omeprazole (PRILOSEC) 40 MG capsule as needed. 06/04/18   [provider]  trimethoprim (TRIMPEX) 100 MG tablet Take 1 tablet (100 mg total) by mouth daily. 07/28/22   MacDiarmid, Lorin Picket, MD  Vibegron (GEMTESA) 75 MG TABS Take 1 tablet (75 mg total) by mouth daily. 07/28/22   Alfredo Martinez, MD    Family History Family History  Problem Relation Age of Onset   Cancer Mother        bladder and kidney cancer   Hypertension Father    Kidney disease Brother    Rheum arthritis Paternal Grandmother     Social History Social History   Tobacco Use   Smoking status: Never    Passive exposure: Never   Smokeless tobacco: Never  Vaping Use   Vaping Use: Never used  Substance  Use Topics   Alcohol use: Yes    Comment: occ   Drug use: No     Allergies   Sulfa antibiotics and Lisinopril   Review of Systems Review of Systems  Constitutional:  Negative for chills, diaphoresis and fever.  Eyes:  Positive for visual disturbance. Negative for pain and discharge.  Respiratory:  Negative for cough and shortness of breath.   Cardiovascular:  Positive for chest pain. Negative for palpitations.  Gastrointestinal:  Negative for nausea and vomiting.  Neurological:  Positive for dizziness. Negative for syncope, facial asymmetry, speech difficulty, weakness and numbness.     Physical Exam Triage Vital Signs ED Triage Vitals  Enc Vitals Group     BP      Pulse      Resp      Temp      Temp src      SpO2      Weight      Height      Head Circumference      Peak Flow      Pain Score      Pain Loc      Pain Edu?      Excl. in GC?    No data found.  Updated Vital Signs BP (!)  162/84   Pulse 65   Temp 97.8 F (36.6 C)   Resp 18   SpO2 98%   Visual Acuity Right Eye Distance:   Left Eye Distance:   Bilateral Distance:    Right Eye Near:   Left Eye Near:    Bilateral Near:     Physical Exam Vitals and nursing note reviewed.  Constitutional:      General: She is not in acute distress.    Appearance: Normal appearance. She is well-developed. She is not ill-appearing.  HENT:     Mouth/Throat:     Mouth: Mucous membranes are moist.  Cardiovascular:     Rate and Rhythm: Normal rate and regular rhythm.     Heart sounds: Normal heart sounds.  Pulmonary:     Effort: Pulmonary effort is normal. No respiratory distress.     Breath sounds: Normal breath sounds.  Musculoskeletal:     Cervical back: Neck supple.     Right lower leg: No edema.     Left lower leg: No edema.  Skin:    General: Skin is warm and dry.     Capillary Refill: Capillary refill takes less than 2 seconds.  Neurological:     General: No focal deficit present.     Mental Status: She is alert and oriented to person, place, and time.     Sensory: No sensory deficit.     Motor: No weakness.     Gait: Gait normal.  Psychiatric:        Mood and Affect: Mood normal.        Behavior: Behavior normal.      UC Treatments / Results  Labs (all labs ordered are listed, but only abnormal results are displayed) Labs Reviewed  POCT FASTING CBG [facility] MANUAL ENTRY - Abnormal; Notable for the following components:      Result Value   POCT Glucose ([facility]) 120 (*)    All other components within normal limits    EKG   Radiology No results found.  Procedures Procedures (including critical care time)  Medications Ordered in UC Medications - No data to display  Initial Impression / Assessment and Plan / UC Course  I  have reviewed the triage vital signs and the nursing notes.  Pertinent labs & imaging results that were available during my care of the patient were reviewed by me and  considered in my medical decision making (see chart for details).    Chest pain, dizziness, elevated blood pressure reading with hypertension.  EKG shows sinus bradycardia, rate 59, no ST elevation or depression, no previous EKG to compare.  Sending patient to the ED for evaluation.  Patient declines EMS.  She drove herself here.  She reports she feels stable to drive herself to the ED.  Discussed risks of driving with her current symptoms but she again declines EMS.  She states she will go to [facility] ED.    Final Clinical Impressions(s) / UC Diagnoses   Final diagnoses:  Chest pain, unspecified type  Dizziness  Elevated blood pressure reading in office with diagnosis of hypertension     Discharge Instructions      Go to the emergency department for evaluation of your chest pain, dizziness, elevated blood pressure.     ED Prescriptions   None    PDMP not reviewed this encounter.   Mickie Bail, NP 11/08/22 1007

## 2022-11-08 NOTE — ED Notes (Signed)
Patient is being discharged from the Urgent Care and sent to the Emergency Department via POV . Per Wendee Beavers NP, patient is in need of higher level of care due to chest pain. Patient is aware and verbalizes understanding of plan of care.  Vitals:   11/08/22 0949 11/08/22 0959  BP: (!) 176/99 (!) 162/84  Pulse: 65   Resp: 18   Temp: 97.8 F (36.6 C)   SpO2: 98%

## 2022-11-08 NOTE — ED Triage Notes (Addendum)
Patient to Urgent Care with complaints of substernal, non-radiating chest pain she describes as pressure. Reports waking up with symptoms. Associates some shortness of breath w/ exertion. Denies any cardiac history.   Reports dizziness w/ blurred vision that started yesterday. States that she had some shakiness in her hands and was concerned about possible high blood sugar. Waking up thirsty.

## 2022-11-08 NOTE — Discharge Instructions (Signed)
Go to the emergency department for evaluation of your chest pain, dizziness, elevated blood pressure.

## 2022-11-21 ENCOUNTER — Ambulatory Visit (HOSPITAL_BASED_OUTPATIENT_CLINIC_OR_DEPARTMENT_OTHER): Payer: Self-pay | Admitting: Obstetrics & Gynecology

## 2022-11-21 ENCOUNTER — Telehealth (HOSPITAL_BASED_OUTPATIENT_CLINIC_OR_DEPARTMENT_OTHER): Payer: Self-pay | Admitting: Obstetrics & Gynecology

## 2022-11-21 NOTE — Telephone Encounter (Signed)
Called patient and about today's appointment patient stated she forgot .

## 2022-11-27 ENCOUNTER — Encounter (HOSPITAL_BASED_OUTPATIENT_CLINIC_OR_DEPARTMENT_OTHER): Payer: Self-pay | Admitting: Surgery

## 2022-12-01 ENCOUNTER — Encounter (HOSPITAL_BASED_OUTPATIENT_CLINIC_OR_DEPARTMENT_OTHER): Payer: Self-pay | Admitting: Surgery

## 2022-12-01 ENCOUNTER — Other Ambulatory Visit: Payer: Self-pay

## 2022-12-03 NOTE — Progress Notes (Signed)
Ensure presurgery drink given with instruction to complete by 0415. Page of written instruction also given. Pt verbalized understanding     Enhanced Recovery after Surgery for Orthopedics Enhanced Recovery after Surgery is a protocol used to improve the stress on your body and your recovery after surgery.  Patient Instructions  The night before surgery:  No food after midnight. ONLY clear liquids after midnight  The day of surgery (if you do NOT have diabetes):  Drink ONE (1) Pre-Surgery Clear Ensure as directed.   This drink was given to you during your hospital  pre-op appointment visit. The pre-op nurse will instruct you on the time to drink the  Pre-Surgery Ensure depending on your surgery time. Finish the drink at the designated time by the pre-op nurse.  Nothing else to drink after completing the  Pre-Surgery Clear Ensure.  The day of surgery (if you have diabetes): Drink ONE (1) Gatorade 2 (G2) as directed. This drink was given to you during your hospital  pre-op appointment visit.  The pre-op nurse will instruct you on the time to drink the   Gatorade 2 (G2) depending on your surgery time. Color of the Gatorade may vary. Red is not allowed. Nothing else to drink after completing the  Gatorade 2 (G2).         If you have questions, please contact your surgeon's office.

## 2022-12-03 NOTE — H&P (Signed)
REFERRING PHYSICIAN: [facility] PROVIDER: Wayne Both, MD MRN: [id number] DOB: June 15, 1960  Subjective  Chief Complaint: New Consultation (ADH RT BREAST )  History of Present Illness: [name] is a 62 y.o. female who is seen as an office consultation for evaluation of New Consultation (ADH RT BREAST )  This is a 62 year old female who was found to have a abnormality on screening mammogram in the right breast. She underwent further imaging and ultrasound showing the small asymmetry with calcifications. A biopsy was performed showing a papilloma with atypical ductal hyperplasia and occasions. Complete removal of the area is recommended to rule out malignancy. She has had previous breast reduction surgery and benign biopsies in the left side. She has a family history of breast cancer in a maternal aunt and maternal grandmother. She did have some clear discharge from the right breast nipple during the mammograms. She is otherwise without complaints. She has no cardiopulmonary issues or problems with anesthesia  Review of Systems: A complete review of systems was obtained from the patient. I have reviewed this information and discussed as appropriate with the patient. See HPI as well for other ROS.  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 Essential hypertension Psoriasis Mixed stress and urge urinary incontinence Diverticulosis of colon Follow-up examination after orthopedic surgery 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 Gastroesophageal reflux disease  Past Surgical History: Procedure Laterality Date ARTHROPLASTY TOTAL KNEE Right 07/07/2017 Procedure: TOTAL KNEE ARTHROPLASTY; Surgeon: Markus Jarvis, MD; Location: [facility] OR; Service: Orthopedics; Laterality: Right; CESAREAN DELIVERY CHOLECYSTECTOMY CHOLECYSTECTOMY REDUCTION MAMMAPLASTY uterian ablation   Allergies Allergen Reactions Sulfa (Sulfonamide Antibiotics) Angioedema Sulfasalazine Swelling Pt has tolerated toradol previously Lisinopril Cough  Current Outpatient Medications on File Prior to Visit Medication Sig Dispense Refill clonazePAM (KLONOPIN) 0.5 MG tablet Take 1 tablet (0.5 mg total) by mouth 2 (two) times daily as needed for Anxiety for up to 10 doses 10 tablet 0 estradioL (ESTRACE) 0.01 % (0.1 mg/gram) vaginal cream 1 gram vaginally twice weekly GEMTESA 75 mg Tab losartan (COZAAR) 50 MG tablet Take 1 tablet (50 mg total) by mouth once daily 90 tablet 3 omeprazole (PRILOSEC) 20 MG DR capsule Take 1 capsule (20 mg total) by mouth once daily 90 capsule 3 trimethoprim 100 mg tablet Take 100 mg by mouth once daily  No current facility-administered medications on file prior to visit.  Family History Problem Relation Age of Onset Bladder Cancer Mother 82 Colon cancer Father 60   Social History  Tobacco Use Smoking Status Never Smokeless Tobacco Never   Social History  Socioeconomic History Marital status: Married Spouse name: Brett Canales Occupational History Occupation: Counsellor Tobacco Use Smoking status: Never Smokeless tobacco: Never Vaping Use Vaping Use: Never used Substance and Sexual Activity Alcohol use: Yes Comment: occasional Drug use: No Sexual activity: Yes Partners: Male Birth control/protection: Post-menopausal  Objective:  Vitals:  BP: (!) 190/90 Pulse: 88 Temp: 36.8 C (98.2 F) SpO2: 96% Weight: 93.5 kg (206 lb 3.2 oz) Height: 170.2  cm (5\' 7" ) PainSc: 0-No pain  Body mass index is 32.3 kg/m.  Physical Exam  She appears well on exam  she  has well-healed incisions from her breast reductions bilaterally. There are no palpable breast masses.  Nipple areolar complexes are normal  There is no axillary adenopathy  Labs, Imaging and Diagnostic Testing: I have reviewed her mammograms, ultrasound, and pathology results  Assessment and Plan:  Diagnoses and all orders for this visit:  Atypical ductal hyperplasia of right breast   I discussed the results of the atypical ductal hyperplasia and papilloma with calcifications in the right breast with the patient and her friend. I gave them a copy the pathology results. Complete surgical excision of this area is recommended given the findings on pathology and her family history to completely rule out a malignancy. I discussed the reasonings for this with them in detail. I next discussed proceeding with a radioactive seed guided right breast lumpectomy. I explained the procedure in detail. We discussed the risks which includes but is not limited to bleeding, infection, injury to surrounding structures, the need for further surgery if malignancy is found, cardiopulmonary issues with anesthesia, postoperative recovery, etc. They understand and agree to proceed with surgery which will be scheduled

## 2022-12-03 NOTE — Anesthesia Preprocedure Evaluation (Signed)
Anesthesia Evaluation  Patient identified by MRN, date of birth, ID band Patient awake    Reviewed: Allergy & Precautions, NPO status , Patient's Chart, lab work & pertinent test results  History of Anesthesia Complications (+) PONV, PROLONGED EMERGENCE and history of anesthetic complications  Airway Mallampati: III  TM Distance: >3 FB Neck ROM: Full    Dental  (+) Dental Advisory Given, Implants   Pulmonary neg shortness of breath, sleep apnea , neg COPD, neg recent URI   Pulmonary exam normal breath sounds clear to auscultation       Cardiovascular hypertension (losartan), Pt. on medications (-) angina (-) Past MI, (-) Cardiac Stents and (-) CABG (-) dysrhythmias  Rhythm:Regular Rate:Normal     Neuro/Psych negative neurological ROS     GI/Hepatic Neg liver ROS,GERD  ,,diverticulosis   Endo/Other  negative endocrine ROS    Renal/GU negative Renal ROS     Musculoskeletal  (+) Arthritis ,    Abdominal  (+) + obese  Peds  Hematology negative hematology ROS (+)   Anesthesia Other Findings Right breast atypical ductal hyperplasia, psoriasis  Reproductive/Obstetrics endometriosis                              Anesthesia Physical Anesthesia Plan  ASA: 2  Anesthesia Plan: General   Post-op Pain Management: Tylenol PO (pre-op)*   Induction: Intravenous  PONV Risk Score and Plan: 4 or greater and Ondansetron, Dexamethasone, Propofol infusion, TIVA and Treatment may vary due to age or medical condition  Airway Management Planned: LMA  Additional Equipment:   Intra-op Plan:   Post-operative Plan: Extubation in OR  Informed Consent: I have reviewed the patients History and Physical, chart, labs and discussed the procedure including the risks, benefits and alternatives for the proposed anesthesia with the patient or authorized representative who has indicated his/her understanding and  acceptance.     Dental advisory given  Plan Discussed with: CRNA and Anesthesiologist  Anesthesia Plan Comments: (Risks of general anesthesia discussed including, but not limited to, sore throat, hoarse voice, chipped/damaged teeth, injury to vocal cords, nausea and vomiting, allergic reactions, lung infection, heart attack, stroke, and death. All questions answered. )        Anesthesia Quick Evaluation

## 2022-12-04 ENCOUNTER — Encounter (HOSPITAL_BASED_OUTPATIENT_CLINIC_OR_DEPARTMENT_OTHER): Payer: Self-pay | Admitting: Surgery

## 2022-12-04 ENCOUNTER — Ambulatory Visit (HOSPITAL_BASED_OUTPATIENT_CLINIC_OR_DEPARTMENT_OTHER): Payer: 59 | Admitting: Anesthesiology

## 2022-12-04 ENCOUNTER — Other Ambulatory Visit: Payer: Self-pay

## 2022-12-04 ENCOUNTER — Ambulatory Visit (HOSPITAL_BASED_OUTPATIENT_CLINIC_OR_DEPARTMENT_OTHER)
Admission: RE | Admit: 2022-12-04 | Discharge: 2022-12-04 | Disposition: A | Discharge status: Home / Self Care | Payer: 59 | Attending: Surgery | Admitting: Surgery

## 2022-12-04 ENCOUNTER — Encounter (HOSPITAL_BASED_OUTPATIENT_CLINIC_OR_DEPARTMENT_OTHER)
Admission: RE | Disposition: A | Discharge status: Home / Self Care | Payer: Self-pay | Source: Home / Self Care | Attending: Surgery

## 2022-12-04 DIAGNOSIS — N6021 Fibroadenosis of right breast: Secondary | ICD-10-CM | POA: Diagnosis not present

## 2022-12-04 DIAGNOSIS — N6081 Other benign mammary dysplasias of right breast: Secondary | ICD-10-CM | POA: Insufficient documentation

## 2022-12-04 DIAGNOSIS — N6041 Mammary duct ectasia of right breast: Secondary | ICD-10-CM | POA: Diagnosis not present

## 2022-12-04 DIAGNOSIS — Z6834 Body mass index (BMI) 34.0-34.9, adult: Secondary | ICD-10-CM | POA: Diagnosis not present

## 2022-12-04 DIAGNOSIS — I10 Essential (primary) hypertension: Secondary | ICD-10-CM | POA: Insufficient documentation

## 2022-12-04 DIAGNOSIS — N6489 Other specified disorders of breast: Secondary | ICD-10-CM | POA: Diagnosis present

## 2022-12-04 DIAGNOSIS — Z79899 Other long term (current) drug therapy: Secondary | ICD-10-CM | POA: Diagnosis not present

## 2022-12-04 DIAGNOSIS — N6091 Unspecified benign mammary dysplasia of right breast: Secondary | ICD-10-CM | POA: Diagnosis not present

## 2022-12-04 DIAGNOSIS — N6011 Diffuse cystic mastopathy of right breast: Secondary | ICD-10-CM | POA: Diagnosis not present

## 2022-12-04 DIAGNOSIS — Z803 Family history of malignant neoplasm of breast: Secondary | ICD-10-CM | POA: Insufficient documentation

## 2022-12-04 DIAGNOSIS — E669 Obesity, unspecified: Secondary | ICD-10-CM

## 2022-12-04 DIAGNOSIS — Z01818 Encounter for other preprocedural examination: Secondary | ICD-10-CM

## 2022-12-04 HISTORY — DX: Gastro-esophageal reflux disease without esophagitis: K21.9

## 2022-12-04 HISTORY — DX: Other complications of anesthesia, initial encounter: T88.59XA

## 2022-12-04 HISTORY — DX: Nausea with vomiting, unspecified: R11.2

## 2022-12-04 HISTORY — DX: Other specified postprocedural states: Z98.890

## 2022-12-04 HISTORY — PX: BREAST LUMPECTOMY WITH RADIOACTIVE SEED LOCALIZATION: SHX6424

## 2022-12-04 HISTORY — DX: Sleep apnea, unspecified: G47.30

## 2022-12-04 SURGERY — BREAST LUMPECTOMY WITH RADIOACTIVE SEED LOCALIZATION
Anesthesia: General | Site: Breast | Laterality: Right

## 2022-12-04 MED ORDER — FENTANYL CITRATE (PF) 100 MCG/2ML IJ SOLN: 25.0000 ug | INTRAMUSCULAR | Status: DC | PRN

## 2022-12-04 MED ORDER — LIDOCAINE 2% (20 MG/ML) 5 ML SYRINGE
INTRAMUSCULAR | Status: AC
  Filled 2022-12-04: qty 5

## 2022-12-04 MED ORDER — SUCCINYLCHOLINE CHLORIDE 200 MG/10ML IV SOSY
PREFILLED_SYRINGE | INTRAVENOUS | Status: AC
  Filled 2022-12-04: qty 10

## 2022-12-04 MED ORDER — KETOROLAC TROMETHAMINE 30 MG/ML IJ SOLN
INTRAMUSCULAR | Status: AC
  Filled 2022-12-04: qty 1

## 2022-12-04 MED ORDER — LACTATED RINGERS IV SOLN: INTRAVENOUS | Status: DC

## 2022-12-04 MED ORDER — FENTANYL CITRATE (PF) 100 MCG/2ML IJ SOLN
INTRAMUSCULAR | Status: DC | PRN
  Administered 2022-12-04 (×2): 50 ug via INTRAVENOUS

## 2022-12-04 MED ORDER — ENSURE PRE-SURGERY PO LIQD: 296.0000 mL | Freq: Once | ORAL | Status: DC

## 2022-12-04 MED ORDER — ACETAMINOPHEN 500 MG PO TABS
ORAL_TABLET | ORAL | Status: AC
  Filled 2022-12-04: qty 2

## 2022-12-04 MED ORDER — ONDANSETRON HCL 4 MG/2ML IJ SOLN
INTRAMUSCULAR | Status: DC | PRN
  Administered 2022-12-04: 4 mg via INTRAVENOUS

## 2022-12-04 MED ORDER — EPHEDRINE 5 MG/ML INJ
INTRAVENOUS | Status: AC
  Filled 2022-12-04: qty 5

## 2022-12-04 MED ORDER — BUPIVACAINE-EPINEPHRINE (PF) 0.25% -1:200000 IJ SOLN
INTRAMUSCULAR | Status: DC | PRN
  Administered 2022-12-04: 20 mL

## 2022-12-04 MED ORDER — PROPOFOL 500 MG/50ML IV EMUL
INTRAVENOUS | Status: DC | PRN
  Administered 2022-12-04: 200 ug/kg/min via INTRAVENOUS

## 2022-12-04 MED ORDER — PROPOFOL 10 MG/ML IV BOLUS
INTRAVENOUS | Status: DC | PRN
  Administered 2022-12-04: 200 mg via INTRAVENOUS

## 2022-12-04 MED ORDER — AMISULPRIDE (ANTIEMETIC) 5 MG/2ML IV SOLN: 10.0000 mg | Freq: Once | INTRAVENOUS | Status: DC | PRN

## 2022-12-04 MED ORDER — BUPIVACAINE-EPINEPHRINE (PF) 0.25% -1:200000 IJ SOLN
INTRAMUSCULAR | Status: AC
  Filled 2022-12-04: qty 180

## 2022-12-04 MED ORDER — DEXAMETHASONE SODIUM PHOSPHATE 10 MG/ML IJ SOLN
INTRAMUSCULAR | Status: AC
  Filled 2022-12-04: qty 1

## 2022-12-04 MED ORDER — CHLORHEXIDINE GLUCONATE CLOTH 2 % EX PADS
6.0000 | MEDICATED_PAD | Freq: Once | CUTANEOUS | Status: AC
  Administered 2022-12-04: 6 via TOPICAL

## 2022-12-04 MED ORDER — PHENYLEPHRINE 80 MCG/ML (10ML) SYRINGE FOR IV PUSH (FOR BLOOD PRESSURE SUPPORT)
PREFILLED_SYRINGE | INTRAVENOUS | Status: AC
  Filled 2022-12-04: qty 10

## 2022-12-04 MED ORDER — KETOROLAC TROMETHAMINE 30 MG/ML IJ SOLN
INTRAMUSCULAR | Status: DC | PRN
  Administered 2022-12-04: 30 mg via INTRAVENOUS

## 2022-12-04 MED ORDER — MIDAZOLAM HCL 2 MG/2ML IJ SOLN
INTRAMUSCULAR | Status: AC
  Filled 2022-12-04: qty 2

## 2022-12-04 MED ORDER — BUPIVACAINE HCL (PF) 0.5 % IJ SOLN
INTRAMUSCULAR | Status: AC
  Filled 2022-12-04: qty 30

## 2022-12-04 MED ORDER — CEFAZOLIN SODIUM-DEXTROSE 2-4 GM/100ML-% IV SOLN
INTRAVENOUS | Status: AC
  Filled 2022-12-04: qty 100

## 2022-12-04 MED ORDER — CHLORHEXIDINE GLUCONATE CLOTH 2 % EX PADS: 6.0000 | MEDICATED_PAD | Freq: Once | CUTANEOUS | Status: DC

## 2022-12-04 MED ORDER — TRAMADOL HCL 50 MG PO TABS: 50.0000 mg | ORAL_TABLET | Freq: Four times a day (QID) | ORAL | 0 refills | Status: DC | PRN

## 2022-12-04 MED ORDER — DEXAMETHASONE SODIUM PHOSPHATE 4 MG/ML IJ SOLN
INTRAMUSCULAR | Status: DC | PRN
  Administered 2022-12-04: 10 mg via INTRAVENOUS

## 2022-12-04 MED ORDER — ACETAMINOPHEN 500 MG PO TABS
1000.0000 mg | ORAL_TABLET | ORAL | Status: AC
  Administered 2022-12-04: 1000 mg via ORAL

## 2022-12-04 MED ORDER — FENTANYL CITRATE (PF) 100 MCG/2ML IJ SOLN
INTRAMUSCULAR | Status: AC
  Filled 2022-12-04: qty 2

## 2022-12-04 MED ORDER — ONDANSETRON HCL 4 MG/2ML IJ SOLN
INTRAMUSCULAR | Status: AC
  Filled 2022-12-04: qty 2

## 2022-12-04 MED ORDER — MIDAZOLAM HCL 5 MG/5ML IJ SOLN
INTRAMUSCULAR | Status: DC | PRN
  Administered 2022-12-04: 2 mg via INTRAVENOUS

## 2022-12-04 MED ORDER — OXYCODONE HCL 5 MG/5ML PO SOLN: 5.0000 mg | Freq: Once | ORAL | Status: DC | PRN

## 2022-12-04 MED ORDER — ATROPINE SULFATE 0.4 MG/ML IV SOLN
INTRAVENOUS | Status: AC
  Filled 2022-12-04: qty 1

## 2022-12-04 MED ORDER — OXYCODONE HCL 5 MG PO TABS: 5.0000 mg | ORAL_TABLET | Freq: Once | ORAL | Status: DC | PRN

## 2022-12-04 MED ORDER — LIDOCAINE HCL (CARDIAC) PF 100 MG/5ML IV SOSY
PREFILLED_SYRINGE | INTRAVENOUS | Status: DC | PRN
  Administered 2022-12-04: 100 mg via INTRAVENOUS

## 2022-12-04 MED ORDER — CEFAZOLIN SODIUM-DEXTROSE 2-4 GM/100ML-% IV SOLN
2.0000 g | INTRAVENOUS | Status: AC
  Administered 2022-12-04: 2 g via INTRAVENOUS

## 2022-12-04 SURGICAL SUPPLY — 47 items
ADH SKN CLS APL DERMABOND .7 (GAUZE/BANDAGES/DRESSINGS) ×1
APL PRP STRL LF DISP 70% ISPRP (MISCELLANEOUS)
APPLIER CLIP 9.375 MED OPEN (MISCELLANEOUS)
APR CLP MED 9.3 20 MLT OPN (MISCELLANEOUS)
BINDER BREAST 3XL (GAUZE/BANDAGES/DRESSINGS) IMPLANT
BINDER BREAST LRG (GAUZE/BANDAGES/DRESSINGS) IMPLANT
BINDER BREAST MEDIUM (GAUZE/BANDAGES/DRESSINGS) IMPLANT
BINDER BREAST XLRG (GAUZE/BANDAGES/DRESSINGS) IMPLANT
BINDER BREAST XXLRG (GAUZE/BANDAGES/DRESSINGS) IMPLANT
BLADE SURG 15 STRL LF DISP TIS (BLADE) ×1 IMPLANT
BLADE SURG 15 STRL SS (BLADE) ×1
CANISTER SUC SOCK COL 7IN (MISCELLANEOUS) IMPLANT
CANISTER SUCT 1200ML W/VALVE (MISCELLANEOUS) IMPLANT
CHLORAPREP W/TINT 26 (MISCELLANEOUS) ×1 IMPLANT
CLIP APPLIE 9.375 MED OPEN (MISCELLANEOUS) IMPLANT
COVER BACK TABLE 60X90IN (DRAPES) ×1 IMPLANT
COVER MAYO STAND STRL (DRAPES) ×1 IMPLANT
COVER PROBE CYLINDRICAL 5X96 (MISCELLANEOUS) ×1 IMPLANT
DERMABOND ADVANCED .7 DNX12 (GAUZE/BANDAGES/DRESSINGS) ×1 IMPLANT
DRAPE LAPAROSCOPIC ABDOMINAL (DRAPES) ×1 IMPLANT
DRAPE UTILITY XL STRL (DRAPES) ×1 IMPLANT
ELECT REM PT RETURN 9FT ADLT (ELECTROSURGICAL) ×1
ELECTRODE REM PT RTRN 9FT ADLT (ELECTROSURGICAL) ×1 IMPLANT
GAUZE SPONGE 4X4 12PLY STRL LF (GAUZE/BANDAGES/DRESSINGS) IMPLANT
GLOVE SURG SIGNA 7.5 PF LTX (GLOVE) ×1 IMPLANT
GOWN STRL REUS W/ TWL LRG LVL3 (GOWN DISPOSABLE) ×1 IMPLANT
GOWN STRL REUS W/ TWL XL LVL3 (GOWN DISPOSABLE) ×1 IMPLANT
GOWN STRL REUS W/TWL LRG LVL3 (GOWN DISPOSABLE) ×1
GOWN STRL REUS W/TWL XL LVL3 (GOWN DISPOSABLE) ×1
KIT MARKER MARGIN INK (KITS) ×1 IMPLANT
NDL HYPO 25X1 1.5 SAFETY (NEEDLE) ×1 IMPLANT
NEEDLE HYPO 25X1 1.5 SAFETY (NEEDLE) ×1 IMPLANT
NS IRRIG 1000ML POUR BTL (IV SOLUTION) IMPLANT
PACK BASIN DAY SURGERY FS (CUSTOM PROCEDURE TRAY) ×1 IMPLANT
PENCIL SMOKE EVACUATOR (MISCELLANEOUS) ×1 IMPLANT
SLEEVE SCD COMPRESS KNEE MED (STOCKING) ×1 IMPLANT
SPIKE FLUID TRANSFER (MISCELLANEOUS) IMPLANT
SPONGE T-LAP 4X18 ~~LOC~~+RFID (SPONGE) ×1 IMPLANT
SUT MNCRL AB 4-0 PS2 18 (SUTURE) ×1 IMPLANT
SUT SILK 2 0 SH (SUTURE) IMPLANT
SUT VIC AB 3-0 SH 27 (SUTURE) ×1
SUT VIC AB 3-0 SH 27X BRD (SUTURE) ×1 IMPLANT
SYR CONTROL 10ML LL (SYRINGE) ×1 IMPLANT
TOWEL GREEN STERILE FF (TOWEL DISPOSABLE) ×1 IMPLANT
TRAY FAXITRON CT DISP (TRAY / TRAY PROCEDURE) ×1 IMPLANT
TUBE CONNECTING 20X1/4 (TUBING) IMPLANT
YANKAUER SUCT BULB TIP NO VENT (SUCTIONS) IMPLANT

## 2022-12-04 NOTE — Anesthesia Procedure Notes (Signed)
Procedure Name: LMA Insertion Date/Time: 12/04/2022 7:29 AM  Performed by: Ronnette Hila, CRNAPre-anesthesia Checklist: Patient identified, Emergency Drugs available, Suction available and Patient being monitored Patient Re-evaluated:Patient Re-evaluated prior to induction Oxygen Delivery Method: Circle system utilized Preoxygenation: Pre-oxygenation with 100% oxygen Induction Type: IV induction Ventilation: Mask ventilation without difficulty LMA: LMA inserted LMA Size: 4.0 Number of attempts: 1 Airway Equipment and Method: Bite block Placement Confirmation: positive ETCO2 Tube secured with: Tape Dental Injury: Teeth and Oropharynx as per pre-operative assessment

## 2022-12-04 NOTE — Op Note (Signed)
RIGHT BREAST LUMPECTOMY WITH RADIOACTIVE SEED LOCALIZATION  Procedure Note  [name] 12/04/2022   Pre-op Diagnosis: RIGHT BREAST ATYPICAL DUCTAL HYPERPLASIA     Post-op Diagnosis: same  Procedure(s): RIGHT BREAST LUMPECTOMY WITH RADIOACTIVE SEED LOCALIZATION  Surgeon(s): Abigail Miyamoto, MD  Anesthesia: General  Staff:  Circulator: Raliegh Scarlet, RN Scrub Person: Maryan Rued, RN  Estimated Blood Loss: Minimal               Specimens: sent to path  Indications: This is a 62 year old female who was found to have some calcifications in her right breast on screening mammography.  A biopsy showed atypical ductal hyperplasia surgical decision was made to proceed with a radioactive seed guided right breast lumpectomy  Procedure: The patient was brought to the operating room and identified as the correct patient.  She was placed supine on the operating table and general anesthesia was induced.  Her right breast was prepped and draped in usual sterile fashion.  Using neoprobe the radioactive seed was located in the upper outer quadrant several centimeters from the nipple areolar complex.  I anesthetized the upper edge of the nipple areolar complex at a previous scar from her reduction surgery with Marcaine.  I then made a circumareolar incision with a scalpel.  I then dissected down to the breast tissue with the cautery and then dissected laterally toward the signal from the radioactive seed with the aid of the neoprobe.  The seed was located in the deep tissue.  I stayed widely around the area with electrocautery going down to the location of the seed and then completed the lumpectomy staying underneath the radioactive seed.  Once the specimen was removed all margins were marked with paint.  An x-ray was performed on the specimen confirming the radioactive seed and calcifications in specimen.  As per radiology the original biopsy clip has migrated far from the area it would not  be included in the specimen.  I achieved hemostasis with cautery.  The lumpectomy specimen was sent to pathology.  I anesthetized the incision further with Marcaine.  I then closed the subcutaneous tissue with interrupted 3-0 Vicryl sutures and closed the skin with a running 4-0 Monocryl.  Dermabond was then applied.  The patient tolerated the procedure well.  All the counts were correct at the end of the procedure.  The patient was then extubated in the operating room and taken in a stable condition to the recovery room.          Abigail Miyamoto   Date: 12/04/2022  Time: 8:02 AM

## 2022-12-04 NOTE — Anesthesia Postprocedure Evaluation (Signed)
Anesthesia Post Note  Patient: [name]  Procedure(s) Performed: RIGHT BREAST LUMPECTOMY WITH RADIOACTIVE SEED LOCALIZATION (Right: Breast)     Patient location during evaluation: PACU Anesthesia Type: General Level of consciousness: awake Pain management: pain level controlled Vital Signs Assessment: post-procedure vital signs reviewed and stable Respiratory status: spontaneous breathing, nonlabored ventilation and respiratory function stable Cardiovascular status: blood pressure returned to baseline and stable Postop Assessment: no apparent nausea or vomiting Anesthetic complications: no   No notable events documented.  Last Vitals:  Vitals:   12/04/22 0815 12/04/22 0830  BP: (!) 108/54 119/72  Pulse: 72 68  Resp: 15 15  Temp:    SpO2: 95% 92%    Last Pain:  Vitals:   12/04/22 0830  TempSrc:   PainSc: 2                  Linton Rump

## 2022-12-04 NOTE — Interval H&P Note (Signed)
History and Physical Interval Note:no change in H and P  12/04/2022 7:05 AM  [name]  has presented today for surgery, with the diagnosis of RIGHT BREAST ATYPICAL DUCTAL HYPERPLASIA.  The various methods of treatment have been discussed with the patient and family. After consideration of risks, benefits and other options for treatment, the patient has consented to  Procedure(s): RIGHT BREAST LUMPECTOMY WITH RADIOACTIVE SEED LOCALIZATION (Right) as a surgical intervention.  The patient's history has been reviewed, patient examined, no change in status, stable for surgery.  I have reviewed the patient's chart and labs.  Questions were answered to the patient's satisfaction.     [name]

## 2022-12-04 NOTE — Transfer of Care (Signed)
Immediate Anesthesia Transfer of Care Note  Patient: [name]  Procedure(s) Performed: RIGHT BREAST LUMPECTOMY WITH RADIOACTIVE SEED LOCALIZATION (Right: Breast)  Patient Location: PACU  Anesthesia Type:General  Level of Consciousness: sedated  Airway & Oxygen Therapy: Patient Spontanous Breathing and Patient connected to face mask oxygen  Post-op Assessment: Report given to RN and Post -op Vital signs reviewed and stable  Post vital signs: Reviewed and stable  Last Vitals:  Vitals Value Taken Time  BP    Temp    Pulse 65 12/04/22 0804  Resp    SpO2 89 % 12/04/22 0804  Vitals shown include unvalidated device data.  Last Pain:  Vitals:   12/04/22 0639  TempSrc: Temporal  PainSc: 0-No pain      Patients Stated Pain Goal: 3 (12/04/22 5621)  Complications: No notable events documented.

## 2022-12-04 NOTE — Discharge Instructions (Addendum)
[facility] Office Phone Number [phone]  BREAST BIOPSY/ PARTIAL MASTECTOMY: POST OP INSTRUCTIONS  Always review your discharge instruction sheet given to you by the facility where your surgery was performed.  IF YOU HAVE DISABILITY OR FAMILY LEAVE FORMS, YOU MUST BRING THEM TO THE OFFICE FOR PROCESSING.  DO NOT GIVE THEM TO YOUR DOCTOR.  A prescription for pain medication may be given to you upon discharge.  Take your pain medication as prescribed, if needed.  If narcotic pain medicine is not needed, then you may take acetaminophen (Tylenol) or ibuprofen (Advil) as needed. Take your usually prescribed medications unless otherwise directed If you need a refill on your pain medication, please contact your pharmacy.  They will contact our office to request authorization.  Prescriptions will not be filled after 5pm or on week-ends. You should eat very light the first 24 hours after surgery, such as soup, crackers, pudding, etc.  Resume your normal diet the day after surgery. Most patients will experience some swelling and bruising in the breast.  Ice packs and a good support bra will help.  Swelling and bruising can take several days to resolve.  It is common to experience some constipation if taking pain medication after surgery.  Increasing fluid intake and taking a stool softener will usually help or prevent this problem from occurring.  A mild laxative (Milk of Magnesia or Miralax) should be taken according to package directions if there are no bowel movements after 48 hours. Unless discharge instructions indicate otherwise, you may remove your bandages 24-48 hours after surgery, and you may shower at that time.  You may have steri-strips (small skin tapes) in place directly over the incision.  These strips should be left on the skin for 7-10 days.  If your surgeon used skin glue on the incision, you may shower in 24 hours.  The glue will flake off over the next 2-3 weeks.  Any  sutures or staples will be removed at the office during your follow-up visit. ACTIVITIES:  You may resume regular daily activities (gradually increasing) beginning the next day.  Wearing a good support bra or sports bra minimizes pain and swelling.  You may have sexual intercourse when it is comfortable. You may drive when you no longer are taking prescription pain medication, you can comfortably wear a seatbelt, and you can safely maneuver your car and apply brakes. RETURN TO WORK:  ______________________________________________________________________________________ [name] should see your doctor in the office for a follow-up appointment approximately two weeks after your surgery.  Your doctor's nurse will typically make your follow-up appointment when she calls you with your pathology report.  Expect your pathology report 2-3 business days after your surgery.  You may call to check if you do not hear from [place] after three days. OTHER INSTRUCTIONS: _YOU MAY SHOWER STARTING TOMORROW ICE PACK, TYLENOL, AND IBUPROFEN ALSO FOR PAIN NO VIGOROUS ACTIVITY FOR ONE WEEK ______________________________________________________________________________________________ _____________________________________________________________________________________________________________________________________ _____________________________________________________________________________________________________________________________________ _____________________________________________________________________________________________________________________________________  WHEN TO CALL YOUR DOCTOR: Fever over 101.0 Nausea and/or vomiting. Extreme swelling or bruising. Continued bleeding from incision. Increased pain, redness, or drainage from the incision.  The clinic staff is available to answer your questions during regular business hours.  Please don't hesitate to call and ask to speak to one of the nurses for clinical  concerns.  If you have a medical emergency, go to the nearest emergency room or call 911.  A surgeon from [facility] is always on call at the hospital.  For further questions, please visit  [email or web address]    Post Anesthesia Home Care Instructions  Activity: Get plenty of rest for the remainder of the day. A responsible individual must stay with you for 24 hours following the procedure.  For the next 24 hours, DO NOT: -Drive a car -Advertising copywriter -Drink alcoholic beverages -Take any medication unless instructed by your physician -Make any legal decisions or sign important papers.  Meals: Start with liquid foods such as gelatin or soup. Progress to regular foods as tolerated. Avoid greasy, spicy, heavy foods. If nausea and/or vomiting occur, drink only clear liquids until the nausea and/or vomiting subsides. Call your physician if vomiting continues.  Special Instructions/Symptoms: Your throat may feel dry or sore from the anesthesia or the breathing tube placed in your throat during surgery. If this causes discomfort, gargle with warm salt water. The discomfort should disappear within 24 hours.  If you had a scopolamine patch placed behind your ear for the management of post- operative nausea and/or vomiting:  1. The medication in the patch is effective for 72 hours, after which it should be removed.  Wrap patch in a tissue and discard in the trash. Wash hands thoroughly with soap and water. 2. You may remove the patch earlier than 72 hours if you experience unpleasant side effects which may include dry mouth, dizziness or visual disturbances. 3. Avoid touching the patch. Wash your hands with soap and water after contact with the patch.    No tylenol until after 12:45 if needed today.  No ibuprofen until after 1:45 if needed today.

## 2022-12-05 ENCOUNTER — Encounter (HOSPITAL_BASED_OUTPATIENT_CLINIC_OR_DEPARTMENT_OTHER): Payer: Self-pay | Admitting: Surgery

## 2022-12-08 LAB — SURGICAL PATHOLOGY

## 2023-03-06 ENCOUNTER — Ambulatory Visit (HOSPITAL_BASED_OUTPATIENT_CLINIC_OR_DEPARTMENT_OTHER): Payer: Self-pay | Admitting: Obstetrics & Gynecology

## 2023-05-01 ENCOUNTER — Encounter (HOSPITAL_BASED_OUTPATIENT_CLINIC_OR_DEPARTMENT_OTHER): Payer: Self-pay | Admitting: Obstetrics & Gynecology

## 2023-05-01 ENCOUNTER — Ambulatory Visit (HOSPITAL_BASED_OUTPATIENT_CLINIC_OR_DEPARTMENT_OTHER): Payer: 59 | Admitting: Obstetrics & Gynecology

## 2023-05-01 ENCOUNTER — Other Ambulatory Visit (HOSPITAL_COMMUNITY)
Admission: RE | Admit: 2023-05-01 | Discharge: 2023-05-01 | Disposition: A | Discharge status: Home / Self Care | Payer: 59 | Source: Ambulatory Visit | Attending: Obstetrics & Gynecology | Admitting: Obstetrics & Gynecology

## 2023-05-01 VITALS — BP 142/106 | HR 65 | Ht 66.25 in | Wt 221.8 lb

## 2023-05-01 DIAGNOSIS — N3281 Overactive bladder: Secondary | ICD-10-CM

## 2023-05-01 DIAGNOSIS — L292 Pruritus vulvae: Secondary | ICD-10-CM | POA: Diagnosis not present

## 2023-05-01 DIAGNOSIS — I10 Essential (primary) hypertension: Secondary | ICD-10-CM

## 2023-05-01 DIAGNOSIS — N6091 Unspecified benign mammary dysplasia of right breast: Secondary | ICD-10-CM

## 2023-05-01 DIAGNOSIS — Z124 Encounter for screening for malignant neoplasm of cervix: Secondary | ICD-10-CM

## 2023-05-01 DIAGNOSIS — Z01419 Encounter for gynecological examination (general) (routine) without abnormal findings: Secondary | ICD-10-CM | POA: Diagnosis not present

## 2023-05-01 MED ORDER — CLOBETASOL PROPIONATE 0.05 % EX OINT: 1.0000 | TOPICAL_OINTMENT | Freq: Two times a day (BID) | CUTANEOUS | 0 refills | Status: DC

## 2023-05-01 NOTE — Progress Notes (Signed)
62 y.o. [id number] Married White or [name] here for annual exam.  Had a lumpectomy this summer with pathology showing focal atypical ductal hyperplasia and focal atypical lobular hyperplasia.  Was referred to oncology.  Didn't go.  We discussed this today.  Feel this is important for at least a consult.  Referral will be placed.  Mammogram and breast MRI every six months discussed.  She reports her lichen sclerosus is bothersome today.  She hasn't had issues in a long while.    No LMP recorded. Patient has had an ablation.          Sexually active: No.    Health Maintenance: Pap:  09/27/2021 Negative History of abnormal Pap:  no MMG:  07/26/2021 Negative, she did it in 2024 as well Colonoscopy:  04/26/2021, f/u 5 years BMD:   guidelines reviewed Screening Labs: does with PCP   reports that she has never smoked. She has never been exposed to tobacco smoke. She has never used smokeless tobacco. She reports current alcohol use. She reports that she does not use drugs.  Past Medical History:  Diagnosis Date   Arthritis    Complication of anesthesia    slow to wake   Endometriosis    GERD (gastroesophageal reflux disease)    Hypertension    Lichen sclerosus    PONV (postoperative nausea and vomiting)    Sleep apnea     Past Surgical History:  Procedure Laterality Date   BREAST LUMPECTOMY WITH RADIOACTIVE SEED LOCALIZATION Right 12/04/2022   Procedure: RIGHT BREAST LUMPECTOMY WITH RADIOACTIVE SEED LOCALIZATION;  Surgeon: Abigail Miyamoto, MD;  Location: [facility];  Service: General;  Laterality: Right;   BREAST SURGERY Left    breast Bx/ fat necrosis   CESAREAN SECTION     CHOLECYSTECTOMY     DILATION AND CURETTAGE OF UTERUS     HYSTEROSCOPY     resection of polyps   LAPAROSCOPY     endometriosis   NOVASURE ABLATION  03/26/2009   REDUCTION MAMMAPLASTY Bilateral 07/28/08   Dr. [name] Contaginasis   RHINOPLASTY     VAGINAL DELIVERY     x2    Current  Outpatient Medications  Medication Sig Dispense Refill   estradiol (ESTRACE) 0.1 MG/GM vaginal cream 1 gram vaginally twice weekly 42.5 g 1   losartan (COZAAR) 50 MG tablet Take 50 mg by mouth daily.     omeprazole (PRILOSEC) 40 MG capsule as needed.     traMADol (ULTRAM) 50 MG tablet Take 1 tablet (50 mg total) by mouth every 6 (six) hours as needed for moderate pain or severe pain. 20 tablet 0   trimethoprim (TRIMPEX) 100 MG tablet Take 1 tablet (100 mg total) by mouth daily. 90 tablet 3   Vibegron (GEMTESA) 75 MG TABS Take 1 tablet (75 mg total) by mouth daily. 90 tablet 3   No current facility-administered medications for this visit.    Family History  Problem Relation Age of Onset   Cancer Mother        bladder and kidney cancer   Hypertension Father    Kidney disease Brother    Rheum arthritis Paternal Grandmother     ROS: Constitutional: negative Genitourinary:negative  Exam:   BP (!) 142/106 (BP Location: Right Arm, Patient Position: Sitting, Cuff Size: Large)   Pulse 65   Ht 5' 6.25" (1.683 m)   Wt 221 lb 12.8 oz (100.6 kg)   BMI 35.53 kg/m   Height: 5' 6.25" (168.3 cm)  General appearance: alert, cooperative and appears stated age Head: Normocephalic, without obvious abnormality, atraumatic Neck: no adenopathy, supple, symmetrical, trachea midline and thyroid normal to inspection and palpation Lungs: clear to auscultation bilaterally Breasts: normal appearance, no masses or tenderness Heart: regular rate and rhythm Abdomen: soft, non-tender; bowel sounds normal; no masses,  no organomegaly Extremities: extremities normal, atraumatic, no cyanosis or edema Skin: Skin color, texture, turgor normal. No rashes or lesions Lymph nodes: Cervical, supraclavicular, and axillary nodes normal. No abnormal inguinal nodes palpated Neurologic: Grossly normal   Pelvic: External genitalia: hypopigmentation around clitoris and down in symmetrical pattern on labia minora               Urethra:  normal appearing urethra with no masses, tenderness or lesions              Bartholins and Skenes: normal                 Vagina: normal appearing vagina with normal color and no discharge, no lesions              Cervix: no lesions              Pap taken: Yes.   Bimanual Exam:  Uterus:  normal size, contour, position, consistency, mobility, non-tender              Adnexa: no mass, fullness, tenderness               Rectovaginal: Confirms               Anus:  normal sphincter tone, no lesions  Chaperone, Ina Homes, CMA, was present for exam.  Assessment/Plan: 1. Well woman exam with routine gynecological exam - Pap smear obtained per pt request.  HR HPV was negative last year. - Mammogram 09/2022 - Colonoscopy 04/2021 - Bone mineral density guidelines reviewed - lab work done with PCP - vaccines reviewed/updated  2. Atypical ductal hyperplasia of right breast - Ambulatory referral to Hematology / Oncology  3. Cervical cancer screening - Cytology - PAP( [place])  4. Vulvar itching - will start temovate ointment bid and recheck 4 weeks  5. Essential hypertension - pt did not take blood pressure medication today so knows this is important

## 2023-05-05 LAB — CYTOLOGY - PAP: Diagnosis: NEGATIVE

## 2023-05-15 ENCOUNTER — Inpatient Hospital Stay: Payer: 59 | Attending: Oncology | Admitting: Oncology

## 2023-05-15 ENCOUNTER — Inpatient Hospital Stay: Payer: 59

## 2023-05-15 ENCOUNTER — Encounter: Payer: Self-pay | Admitting: Oncology

## 2023-05-15 VITALS — BP 137/86 | HR 61 | Temp 97.5°F | Resp 18 | Wt 223.9 lb

## 2023-05-15 DIAGNOSIS — Z888 Allergy status to other drugs, medicaments and biological substances status: Secondary | ICD-10-CM | POA: Diagnosis not present

## 2023-05-15 DIAGNOSIS — R921 Mammographic calcification found on diagnostic imaging of breast: Secondary | ICD-10-CM

## 2023-05-15 DIAGNOSIS — Z841 Family history of disorders of kidney and ureter: Secondary | ICD-10-CM

## 2023-05-15 DIAGNOSIS — N6081 Other benign mammary dysplasias of right breast: Secondary | ICD-10-CM

## 2023-05-15 DIAGNOSIS — Z8261 Family history of arthritis: Secondary | ICD-10-CM | POA: Diagnosis not present

## 2023-05-15 DIAGNOSIS — Z9049 Acquired absence of other specified parts of digestive tract: Secondary | ICD-10-CM | POA: Diagnosis not present

## 2023-05-15 DIAGNOSIS — G8929 Other chronic pain: Secondary | ICD-10-CM

## 2023-05-15 DIAGNOSIS — Z8051 Family history of malignant neoplasm of kidney: Secondary | ICD-10-CM

## 2023-05-15 DIAGNOSIS — I10 Essential (primary) hypertension: Secondary | ICD-10-CM

## 2023-05-15 DIAGNOSIS — Z882 Allergy status to sulfonamides status: Secondary | ICD-10-CM

## 2023-05-15 DIAGNOSIS — N6021 Fibroadenosis of right breast: Secondary | ICD-10-CM

## 2023-05-15 DIAGNOSIS — Z8052 Family history of malignant neoplasm of bladder: Secondary | ICD-10-CM

## 2023-05-15 DIAGNOSIS — M2550 Pain in unspecified joint: Secondary | ICD-10-CM | POA: Diagnosis not present

## 2023-05-15 DIAGNOSIS — N6489 Other specified disorders of breast: Secondary | ICD-10-CM

## 2023-05-15 DIAGNOSIS — Z809 Family history of malignant neoplasm, unspecified: Secondary | ICD-10-CM | POA: Insufficient documentation

## 2023-05-15 DIAGNOSIS — Z79899 Other long term (current) drug therapy: Secondary | ICD-10-CM

## 2023-05-15 DIAGNOSIS — Z8249 Family history of ischemic heart disease and other diseases of the circulatory system: Secondary | ICD-10-CM | POA: Insufficient documentation

## 2023-05-15 DIAGNOSIS — N6099 Unspecified benign mammary dysplasia of unspecified breast: Secondary | ICD-10-CM | POA: Insufficient documentation

## 2023-05-15 NOTE — Assessment & Plan Note (Addendum)
Atypical ductal hyperplasia and atypical lobular hyperplasia Discussed with patient that these are high risk lesions which are associated with a generalized, bilateral increase in breast cancer risk. Recommendation  Active Surveillance: life time annual screening mammography as well as  history and physical examination every 6 to 12 months Option of endocrine therapy as chemoprevention was discussed.  Patient is postmenopausal.  Consider aromatase inhibitor.  I recommend DEXA for evaluation of baseline bone density.

## 2023-05-15 NOTE — Assessment & Plan Note (Signed)
Refer to genetic counselor.  

## 2023-05-15 NOTE — Progress Notes (Signed)
Hematology/Oncology Consult Note [phone] [phone]     REFERRING PROVIDER: Jerene Bears, MD    CHIEF COMPLAINTS/PURPOSE OF CONSULTATION:  Atypical ductal hyperplasia  ASSESSMENT & PLAN:   Atypical ductal hyperplasia of breast Atypical ductal hyperplasia and atypical lobular hyperplasia Discussed with patient that these are high risk lesions which are associated with a generalized, bilateral increase in breast cancer risk. Recommendation  Active Surveillance: life time annual screening mammography as well as  history and physical examination every 6 to 12 months Option of endocrine therapy as chemoprevention was discussed.  Patient is postmenopausal.  Consider aromatase inhibitor.  I recommend DEXA for evaluation of baseline bone density.   Family history of cancer Refer to genetic counselor   Orders Placed This Encounter  Procedures   DG Bone Density    Standing Status:   Future    Standing Expiration Date:   05/14/2024    Order Specific Question:   Reason for Exam (SYMPTOM  OR DIAGNOSIS REQUIRED)    Answer:   high risk breast cance r    Order Specific Question:   Preferred imaging location?    Answer:    [facility]   Ambulatory referral to Genetics    Referral Priority:   Routine    Referral Type:   Consultation    Referral Reason:   Specialty Services Required    Number of Visits Requested:   1   Follow-up in 2 months to go over results and management plan. All questions were answered. The patient knows to call the clinic with any problems, questions or concerns.  Rickard Patience, MD, PhD [facility] Hematology Oncology 05/15/2023    HISTORY OF PRESENTING ILLNESS:  [name] 62 y.o. female presents to establish care for atypical ductal hyperplasia patient has been receiving regular mammograms ordered by her gynecologist  Patient has had abnormal screening mammogram of the right breast.  Additional diagnostic mammogram and  ultrasound showed small asymmetry with calcifications.  Mammogram results were not available to me. 08/20/2022, right breast core needle biopsy 11:00 8 cm from nipple showed Intraductal papilloma with focal atypical ductal hyperplasia, usual ductal hyperplasia and associated micro calcifications.  Fibrocystic changes including apocrine metaplasia.  Adenosis.  Focal fat necrosis.  Patient was evaluated by Dr. Abigail Miyamoto in April 2024. 12/04/2022 patient is status post right lumpectomy Pathology showed A. RIGHT BREAST, LUMPECTOMY:  Focal atypical ductal hyperplasia (see comment)  Focal atypical lobular hyperplasia (ALH)  Fibrocystic changes including stromal fibrosis, adenosis, cystic  dilatation of ducts, apocrine metaplasia, columnar cell change and usual  duct hyperplasia  Microcalcifications present within ADH, adenosis and usual duct  hyperplasia  No histologic biopsy site identified (seed present; no biopsy clip)    Patient presents to establish care for management of atypical ductal hyperplasia/high risk breast lesion. Menarche at age of 19 First live birth at age of 7 OCP use: no History of hysterectomy: no Menopausal status: Postmenopausal History of HRT use: no  History of chest radiation: Never Number of previous breast biopsies: 2011, status post left breast biopsy-malignancy.  Recently, the patient has been experiencing intermittent shooting pains in the surgical area, which she initially attributed to the healing process. + Chronic joint pain. She recently started on semaglutide for weight loss. The patient has a family history of bladder and kidney cancer  MEDICAL HISTORY:  Past Medical History:  Diagnosis Date   Arthritis    Complication of anesthesia    slow to wake   Endometriosis  GERD (gastroesophageal reflux disease)    Hypertension    Lichen sclerosus    PONV (postoperative nausea and vomiting)    Sleep apnea     SURGICAL HISTORY: Past  Surgical History:  Procedure Laterality Date   BREAST LUMPECTOMY WITH RADIOACTIVE SEED LOCALIZATION Right 12/04/2022   Procedure: RIGHT BREAST LUMPECTOMY WITH RADIOACTIVE SEED LOCALIZATION;  Surgeon: Abigail Miyamoto, MD;  Location:  SURGERY CENTER;  Service: General;  Laterality: Right;   BREAST SURGERY Left    breast Bx/ fat necrosis   CESAREAN SECTION     CHOLECYSTECTOMY     DILATION AND CURETTAGE OF UTERUS     HYSTEROSCOPY     resection of polyps   LAPAROSCOPY     endometriosis   NOVASURE ABLATION  03/26/2009   REDUCTION MAMMAPLASTY Bilateral 07/28/08   Dr. [name] Contaginasis   RHINOPLASTY     VAGINAL DELIVERY     x2    SOCIAL HISTORY: Social History   Socioeconomic History   Marital status: Married    Spouse name: Not on file   Number of children: Not on file   Years of education: Not on file   Highest education level: Not on file  Occupational History   Not on file  Tobacco Use   Smoking status: Never    Passive exposure: Never   Smokeless tobacco: Never  Vaping Use   Vaping status: Never Used  Substance and Sexual Activity   Alcohol use: Yes    Comment: occ   Drug use: No   Sexual activity: Yes    Birth control/protection: Other-see comments    Comment: vasectomy  Other Topics Concern   Not on file  Social History Narrative   Not on file   Social Determinants of Health   Financial Resource Strain: Low Risk  (05/15/2023)   Overall Financial Resource Strain (CARDIA)    Difficulty of Paying Living Expenses: Not very hard  Food Insecurity: No Food Insecurity (05/15/2023)   Hunger Vital Sign    Worried About Running Out of Food in the Last Year: Never true    Ran Out of Food in the Last Year: Never true  Transportation Needs: No Transportation Needs (05/15/2023)   PRAPARE - Administrator, Civil Service (Medical): No    Lack of Transportation (Non-Medical): No  Physical Activity: Not on file  Stress: Not on file  Social  Connections: Not on file  Intimate Partner Violence: Not At Risk (05/15/2023)   Humiliation, Afraid, Rape, and Kick questionnaire    Fear of Current or Ex-Partner: No    Emotionally Abused: No    Physically Abused: No    Sexually Abused: No    FAMILY HISTORY: Family History  Problem Relation Age of Onset   Cancer Mother        bladder and kidney cancer   Hypertension Father    Kidney disease Brother    Rheum arthritis Paternal Grandmother     ALLERGIES:  is allergic to sulfa antibiotics and lisinopril.  MEDICATIONS:  Current Outpatient Medications  Medication Sig Dispense Refill   clobetasol ointment (TEMOVATE) 0.05 % Apply 1 Application topically 2 (two) times daily. Apply as directed twice daily.  Once itching stopped, decrease to nightly x 4 week total 60 g 0   estradiol (ESTRACE) 0.1 MG/GM vaginal cream 1 gram vaginally twice weekly 42.5 g 1   losartan (COZAAR) 50 MG tablet Take 50 mg by mouth daily.     omeprazole (PRILOSEC) 40 MG  capsule as needed.     traMADol (ULTRAM) 50 MG tablet Take 1 tablet (50 mg total) by mouth every 6 (six) hours as needed for moderate pain or severe pain. 20 tablet 0   trimethoprim (TRIMPEX) 100 MG tablet Take 1 tablet (100 mg total) by mouth daily. 90 tablet 3   Vibegron (GEMTESA) 75 MG TABS Take 1 tablet (75 mg total) by mouth daily. 90 tablet 3   No current facility-administered medications for this visit.    Review of Systems  Constitutional:  Negative for appetite change, chills, fatigue and fever.  HENT:   Negative for hearing loss and voice change.   Eyes:  Negative for eye problems.  Respiratory:  Negative for chest tightness and cough.   Cardiovascular:  Negative for chest pain.  Gastrointestinal:  Negative for abdominal distention, abdominal pain and blood in stool.  Endocrine: Negative for hot flashes.  Genitourinary:  Negative for difficulty urinating and frequency.   Musculoskeletal:  Positive for arthralgias.  Skin:  Negative  for itching and rash.  Neurological:  Negative for extremity weakness.  Hematological:  Negative for adenopathy.  Psychiatric/Behavioral:  Negative for confusion.    Intermittent sharp right breast pain  PHYSICAL EXAMINATION: ECOG PERFORMANCE STATUS: 0 - Asymptomatic  Vitals:   05/15/23 0931  BP: 137/86  Pulse: 61  Resp: 18  Temp: (!) 97.5 F (36.4 C)  SpO2: 100%   Filed Weights   05/15/23 0931  Weight: 223 lb 14.4 oz (101.6 kg)    Physical Exam Constitutional:      General: She is not in acute distress.    Appearance: She is not diaphoretic.  HENT:     Head: Normocephalic and atraumatic.  Eyes:     General: No scleral icterus. Cardiovascular:     Rate and Rhythm: Normal rate and regular rhythm.  Pulmonary:     Effort: Pulmonary effort is normal. No respiratory distress.     Breath sounds: Normal breath sounds.  Abdominal:     General: There is no distension.     Palpations: Abdomen is soft.     Tenderness: There is no abdominal tenderness.  Musculoskeletal:        General: Normal range of motion.     Cervical back: Normal range of motion and neck supple.  Skin:    General: Skin is warm and dry.     Findings: No erythema.  Neurological:     Mental Status: She is alert and oriented to person, place, and time. Mental status is at baseline.     Cranial Nerves: No cranial nerve deficit.     Motor: No abnormal muscle tone.  Psychiatric:        Mood and Affect: Mood and affect normal.      LABORATORY DATA:  I have reviewed the data as listed    Latest Ref Rng & Units 07/19/2018    3:37 PM 11/09/2012    4:12 PM 11/15/2011    8:09 AM  CBC  WBC 4.0 - 10.5 K/uL 8.3   9.7   Hemoglobin 12.0 - 15.0 g/dL 21.3  08.6  57.8   Hematocrit 36.0 - 46.0 % 39.9   39.7   Platelets 150 - 400 K/uL 216   197       Latest Ref Rng & Units 07/19/2018    3:37 PM 03/05/2018    8:35 AM 08/09/2015    8:57 AM  CMP  Glucose 70 - 99 mg/dL 469   94  BUN 6 - 20 mg/dL 15   21    Creatinine 7.84 - 1.00 mg/dL 6.96  2.95  2.84   Sodium 135 - 145 mmol/L 135   139   Potassium 3.5 - 5.1 mmol/L 3.4   4.6   Chloride 98 - 111 mmol/L 103   105   CO2 22 - 32 mmol/L 24   26   Calcium 8.9 - 10.3 mg/dL 8.8   9.3   Total Protein 6.5 - 8.1 g/dL 6.9   6.6   Total Bilirubin 0.3 - 1.2 mg/dL 1.2   0.7   Alkaline Phos 38 - 126 U/L 68   66   AST 15 - 41 U/L 25   19   ALT 0 - 44 U/L 48   29      RADIOGRAPHIC STUDIES: I have personally reviewed the radiological images as listed and agreed with the findings in the report. No results found.

## 2023-06-04 ENCOUNTER — Other Ambulatory Visit (HOSPITAL_BASED_OUTPATIENT_CLINIC_OR_DEPARTMENT_OTHER): Payer: Self-pay | Admitting: Obstetrics & Gynecology

## 2023-06-05 ENCOUNTER — Other Ambulatory Visit (HOSPITAL_BASED_OUTPATIENT_CLINIC_OR_DEPARTMENT_OTHER): Payer: Self-pay | Admitting: Certified Nurse Midwife

## 2023-06-09 ENCOUNTER — Inpatient Hospital Stay: Payer: 59 | Admitting: Licensed Clinical Social Worker

## 2023-06-09 ENCOUNTER — Inpatient Hospital Stay: Payer: 59

## 2023-06-09 ENCOUNTER — Encounter: Payer: Self-pay | Admitting: Licensed Clinical Social Worker

## 2023-06-09 DIAGNOSIS — N6099 Unspecified benign mammary dysplasia of unspecified breast: Secondary | ICD-10-CM | POA: Diagnosis not present

## 2023-06-09 DIAGNOSIS — Z803 Family history of malignant neoplasm of breast: Secondary | ICD-10-CM | POA: Diagnosis not present

## 2023-06-09 DIAGNOSIS — Z8052 Family history of malignant neoplasm of bladder: Secondary | ICD-10-CM

## 2023-06-09 DIAGNOSIS — Z8051 Family history of malignant neoplasm of kidney: Secondary | ICD-10-CM | POA: Diagnosis not present

## 2023-06-09 NOTE — Progress Notes (Signed)
 REFERRING PROVIDER: Babara Call, MD [address] [place],  [place] [address]  PRIMARY PROVIDER:  Velna Tinnie Carolus, PA-C  PRIMARY REASON FOR VISIT:  1. Atypical ductal hyperplasia of breast   2. Family history of bladder cancer   3. Family history of kidney cancer   4. Family history of breast cancer      HISTORY OF PRESENT ILLNESS:   [name], a 62 y.o. female, was seen for a [facility] cancer genetics consultation at the request of Dr. Babara due to a family history of cancer.  [name] presents to clinic today to discuss the possibility of a hereditary predisposition to cancer, genetic testing, and to further clarify her future cancer risks, as well as potential cancer risks for family members.   CANCER HISTORY:  [name] is a 62 y.o. female with no personal history of cancer.    RISK FACTORS:  Menarche was at age 36.  First live birth at age 80.  Ovaries intact: yes.  Hysterectomy: no.  Menopausal status: postmenopausal.  HRT use: 0 years. Colonoscopy: yes; normal. Mammogram: Yes Breast biopsy: Yes, ADH + ALH on lumpectomy 11/2022  Past Medical History:  Diagnosis Date   Arthritis    Complication of anesthesia    slow to wake   Endometriosis    GERD (gastroesophageal reflux disease)    Hypertension    Lichen sclerosus    PONV (postoperative nausea and vomiting)    Sleep apnea     Past Surgical History:  Procedure Laterality Date   BREAST LUMPECTOMY WITH RADIOACTIVE SEED LOCALIZATION Right 12/04/2022   Procedure: RIGHT BREAST LUMPECTOMY WITH RADIOACTIVE SEED LOCALIZATION;  Surgeon: Vernetta Berg, MD;  Location: [facility];  Service: General;  Laterality: Right;   BREAST SURGERY Left    breast Bx/ fat necrosis   CESAREAN SECTION     CHOLECYSTECTOMY     DILATION AND CURETTAGE OF UTERUS     HYSTEROSCOPY     resection of polyps   LAPAROSCOPY     endometriosis   NOVASURE ABLATION  03/26/2009   REDUCTION MAMMAPLASTY Bilateral 07/28/08    Dr. Ronal Caldron Contaginasis   RHINOPLASTY     VAGINAL DELIVERY     x2    FAMILY HISTORY:  We obtained a detailed, 4-generation family history.  Significant diagnoses are listed below: Family History  Problem Relation Age of Onset   Bladder Cancer Mother        + kidney, dx 37   Hypertension Father    Colon cancer Father 75   Kidney disease Brother    Breast cancer Maternal Aunt        dx 24s   Rheum arthritis Paternal Grandmother    Breast cancer Cousin        dx 56s    [name] has 2 sons and 1 daughter, no cancers. She has 3 brothers, no cancers.  [name] mother had bladder/kidney cancer diagnosed at 67 and passed at 36. She also had history of lumpectomy but did not have breast cancer. A maternal aunt passed of breast cancer in her 14s and her daughter had breast cancer in her 62s. No other known cancers on this side of the family.  [name] father had colon cancer at 61 and passed at 82. No other known cancers on this side of the family.  [name] is unaware of previous family history of genetic testing for hereditary cancer risks. There is no reported Ashkenazi Jewish ancestry. There  is no known consanguinity.    GENETIC COUNSELING ASSESSMENT: [name] is a 62 y.o. female with a family history of cancer  which is somewhat suggestive of a hereditary cancer syndrome and predisposition to cancer. We, therefore, discussed and recommended the following at today's visit.   DISCUSSION: We discussed that approximately 10% of cancer is hereditary. Most cases of hereditary breast cancer are associated with BRCA1/2 genes, although there are other genes associated with hereditary cancer as well including Lynch syndrome genes which can increase the risk for bladder cancer. Cancers and risks are gene specific. We discussed that testing is beneficial for several reasons including knowing about cancer risks, identifying potential screening and risk-reduction options that may be  appropriate, and to understand if other family members could be at risk for cancer and allow them to undergo genetic testing.   We reviewed the characteristics, features and inheritance patterns of hereditary cancer syndromes. We also discussed genetic testing, including the appropriate family members to test, the process of testing, insurance coverage and turn-around-time for results. We discussed the implications of a negative, positive and/or variant of uncertain significant result. We recommended [name] pursue genetic testing for the [facility] CancerNext-Expanded+RNA gene panel.   Based on [name]'s family history of cancer, she meets medical criteria for genetic testing. Though [name] is not personally affected, there are no affected family members that are willing/able to undergo hereditary cancer testing.  Therefore, [name] the most informative family member available. Despite that she meets criteria, she may still have an out of pocket cost.   PLAN: After considering the risks, benefits, and limitations, [name] provided informed consent to pursue genetic testing and the blood sample was sent to [facility] for analysis of the CancerNext-Expanded+RNA panel. Results should be available within approximately 2-3 weeks' time, at which point they will be disclosed by telephone to [name], as will any additional recommendations warranted by these results. [name] will receive a summary of her genetic counseling visit and a copy of her results once available. This information will also be available in Epic.   [name] questions were answered to her satisfaction today. Our contact information was provided should additional questions or concerns arise. Thank you for the referral and allowing us  to share in the care of your patient.   Dena Cary, MS, [facility] Genetic Counselor [email or web address] Phone: [phone]  The patient was seen for a total of 25  minutes in face-to-face genetic counseling.  Dr. Delinda was available for discussion regarding this case.   _______________________________________________________________________ For Office Staff:  Number of people involved in session: 1 Was an Intern/ student involved with case: no

## 2023-06-19 ENCOUNTER — Ambulatory Visit (HOSPITAL_BASED_OUTPATIENT_CLINIC_OR_DEPARTMENT_OTHER): Payer: 59 | Admitting: Obstetrics & Gynecology

## 2023-06-25 ENCOUNTER — Telehealth: Payer: Self-pay | Admitting: Oncology

## 2023-06-25 ENCOUNTER — Telehealth (HOSPITAL_BASED_OUTPATIENT_CLINIC_OR_DEPARTMENT_OTHER): Payer: Self-pay | Admitting: Obstetrics & Gynecology

## 2023-06-25 NOTE — Telephone Encounter (Signed)
Patient call and would  like for a order to be put in for a bone density at [facility].

## 2023-06-25 NOTE — Telephone Encounter (Signed)
Patient left a message that she would like to cancel her BD scan for [facility] and have Dr. Cathie Hoops put an order in for [facility]- She would like a call back at number in chart

## 2023-06-25 NOTE — Telephone Encounter (Signed)
Called pt x3 today and no answer. Left 2 VM letting her know that she can call [facility] in [place] to schedule BD, but they are booked out until September. Also informed her that she can contact [facility] to cancel BD, if she decides to go to [facility] given their availability. Ph number to [facility] and [facility] provided.

## 2023-06-26 NOTE — Telephone Encounter (Signed)
Per BD cancellation note: pt will have BD at [facility]

## 2023-06-30 ENCOUNTER — Telehealth: Payer: Self-pay | Admitting: Genetic Counselor

## 2023-06-30 ENCOUNTER — Encounter: Payer: Self-pay | Admitting: Genetic Counselor

## 2023-06-30 DIAGNOSIS — Z1379 Encounter for other screening for genetic and chromosomal anomalies: Secondary | ICD-10-CM | POA: Insufficient documentation

## 2023-06-30 NOTE — Telephone Encounter (Signed)
Contacted patient in attempt to disclose results of genetic testing.  LVM with contact information requesting a call back.  

## 2023-07-02 ENCOUNTER — Encounter: Payer: Self-pay | Admitting: Genetic Counselor

## 2023-07-02 ENCOUNTER — Ambulatory Visit: Payer: Self-pay | Admitting: Genetic Counselor

## 2023-07-02 ENCOUNTER — Encounter (HOSPITAL_BASED_OUTPATIENT_CLINIC_OR_DEPARTMENT_OTHER): Payer: Self-pay | Admitting: *Deleted

## 2023-07-02 DIAGNOSIS — Z9189 Other specified personal risk factors, not elsewhere classified: Secondary | ICD-10-CM | POA: Insufficient documentation

## 2023-07-02 DIAGNOSIS — Z8052 Family history of malignant neoplasm of bladder: Secondary | ICD-10-CM

## 2023-07-02 DIAGNOSIS — Z1379 Encounter for other screening for genetic and chromosomal anomalies: Secondary | ICD-10-CM

## 2023-07-02 DIAGNOSIS — Z8051 Family history of malignant neoplasm of kidney: Secondary | ICD-10-CM

## 2023-07-02 DIAGNOSIS — Z803 Family history of malignant neoplasm of breast: Secondary | ICD-10-CM

## 2023-07-02 DIAGNOSIS — N6099 Unspecified benign mammary dysplasia of unspecified breast: Secondary | ICD-10-CM

## 2023-07-02 HISTORY — DX: Other specified personal risk factors, not elsewhere classified: Z91.89

## 2023-07-02 NOTE — Telephone Encounter (Signed)
Disclosed negative genetics and VUS in BARD1.  TC score >20%.  Recommended she continue to follow up with high risk providers for high risk screening (annual mammograms w/ consideration of annual breast MRIs)

## 2023-07-02 NOTE — Progress Notes (Signed)
HPI:   [name] was previously seen in the [facility] due to a family history of breast and other cancers and concerns regarding a hereditary predisposition to cancer.    [name] recent genetic test results were disclosed to her by telephone. These results and recommendations are discussed in more detail below.  CANCER HISTORY:  Oncology History   No history exists.    FAMILY HISTORY:  We obtained a detailed, 4-generation family history.  Significant diagnoses are listed below: Family History  Problem Relation Age of Onset   Bladder Cancer Mother        + kidney, dx 99   Hypertension Father    Colon cancer Father 62   Kidney disease Brother    Breast cancer Maternal Aunt        dx 2s   Rheum arthritis Paternal Grandmother    Breast cancer Cousin        dx 42s    [name] has 2 sons and 1 daughter, no cancers. She has 3 brothers, no cancers.   [name] mother had bladder/kidney cancer diagnosed at 73 and passed at 84. She also had history of lumpectomy but did not have breast cancer. A maternal aunt passed of breast cancer in her 48s and her daughter had breast cancer in her 28s. No other known cancers on this side of the family.   [name] father had colon cancer at 34 and passed at 65. No other known cancers on this side of the family.   [name] is unaware of previous family history of genetic testing for hereditary cancer risks. There is no reported Ashkenazi Jewish ancestry. There is no known consanguinity.   GENETIC TEST RESULTS:  The [facility] CancerNext-Expanded +RNAinsight Panel found no pathogenic mutations.   The CancerNext-Expanded gene panel offered by [facility] and includes sequencing, rearrangement, and RNA analysis for the following 76 genes: AIP, ALK, APC, ATM, AXIN2, BAP1, BARD1, BMPR1A, BRCA1, BRCA2, BRIP1, CDC73, CDH1, CDK4, CDKN1B, CDKN2A, CEBPA, CHEK2, CTNNA1, DDX41, DICER1, ETV6, FH, FLCN, GATA2, LZTR1, MAX,  MBD4, MEN1, MET, MLH1, MSH2, MSH3, MSH6, MUTYH, NF1, NF2, NTHL1, PALB2, PHOX2B, PMS2, POT1, PRKAR1A, PTCH1, PTEN, RAD51C, RAD51D, RB1, RET, RUNX1, SDHA, SDHAF2, SDHB, SDHC, SDHD, SMAD4, SMARCA4, SMARCB1, SMARCE1, STK11, SUFU, TMEM127, TP53, TSC1, TSC2, VHL, and WT1 (sequencing and deletion/duplication); EGFR, HOXB13, KIT, MITF, PDGFRA, POLD1, and POLE (sequencing only); EPCAM and GREM1 (deletion/duplication only).    The test report has been scanned into EPIC and is located under the Molecular Pathology section of the Results Review tab.  A portion of the result report is included below for reference. Genetic testing reported out on June 24, 2023.     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  At this time, it is unknown if this variant is associated with an increased risk for cancer or if it is benign, but most uncertain variants are reclassified to benign. It should not be used to make medical management decisions. With time, we suspect the laboratory will determine the significance of this variant, if any. If the laboratory reclassifies this variant, we will attempt to contact [name] to discuss it further.   Even though a pathogenic variant was not identified, possible explanations for the cancer in the family may include: There may be no hereditary risk for cancer in the family. The cancers in [name] and/or her family may be sporadic/familial or due to  other genetic and environmental factors.  Most cancer is not hereditary.  There may be a gene mutation in one of these genes that current testing methods cannot detect but that chance is small. There could be another gene that has not yet been discovered, or that we have not yet tested, that is responsible for the cancer diagnoses in the family.  It is also possible there is a hereditary cause for the cancer in the family that [name] did not inherit. The variant of  uncertain significance detected in the BARD1 gene may be reclassified as a pathogenic variant in the future. At this time, we do not know if this variant increases the risk for cancer.  Therefore, it is important to remain in touch with cancer genetics in the future so that we can continue to offer [name] the most up to date genetic testing.    ADDITIONAL GENETIC TESTING:   [name] genetic testing was fairly extensive.  If there are additional relevant genes identified to increase cancer risk that can be analyzed in the future, we would be happy to discuss and coordinate this testing at that time.    CANCER SCREENING RECOMMENDATIONS:  [name] test result is considered negative (normal).  This means that we have not identified a hereditary cause for her family history of cancer at this time.   An individual's cancer risk and medical management are not determined by genetic test results alone. Overall cancer risk assessment incorporates additional factors, including personal medical history, family history, and any available genetic information that may result in a personalized plan for cancer prevention and surveillance. Therefore, it is recommended she continue to follow the cancer management and screening guidelines provided by her primary healthcare provider.  [name] has been determined to be at high risk for breast cancer.  her lifetime risk for breast cancer based on Tyrer-Cuzick risk model is ~25%.  This risk estimate can change over time and may be repeated to reflect new information in her personal or family history in the future.  For females with a greater than 20% lifetime risk of breast cancer, the [employer] (NCCN) recommends the following:  Clinical encounter every 6-12 months to begin when identified as being at increased risk, but not before age 21  Annual mammograms. Tomosynthesis is recommended starting 10 years earlier than the youngest  breast cancer diagnosis in the family or at age 52 (whichever comes first), but not before age 102   Annual breast MRI starting 10 years earlier than the youngest breast cancer diagnosis in the family or at age 69 (whichever comes first), but not before age 21   [name] is already being followed in the high risk breast clinic.        [name] should also have colonoscopies at least every 5 years, or as recommended by her GI providers, given her father's history of colon cancer.   RECOMMENDATIONS FOR FAMILY MEMBERS:   Since she did not inherit a identifiable mutation in a cancer predisposition gene included on this panel, her children could not have inherited a known mutation from her in one of these genes. Individuals in this family might be at some increased risk of developing cancer, over the general population risk, due to the family history of cancer.  Individuals in the family should notify their providers of the family history of cancer. We recommend women in this family have a yearly mammogram beginning at age 44, or 106  years younger than the earliest onset of cancer, an annual clinical breast exam, and perform monthly breast self-exams.  Risk models that take into account family history and hormonal history may be helpful in determining appropriate breast cancer screening options for family members.  First degree relatives of those with colon cancer should receive colonoscopies beginning at age 57, or 10 years prior to the earliest diagnosis of colon cancer in the family, and receive colonoscopies at least every 5 years, or as recommended by their gastroenterologist.   Other members of the family may still carry a pathogenic variant in one of these genes that [name]. Schulte did not inherit. Based on her mother's history of bladder and kidney cancer in her 12s, we recommend first degree relatives of her mother have genetic counseling and testing. [name]. Saltos can let us know if we can be of any  assistance in coordinating genetic counseling and/or testing for these family members.   We do not recommend familial testing for the BARD1 variant of uncertain significance (VUS).  FOLLOW-UP:  Cancer genetics is a rapidly advancing field and it is possible that new genetic tests will be appropriate for her and/or her family members in the future. We encourage [name]. Derden to remain in contact with cancer genetics, so we can update her personal and family histories and let her know of advances in cancer genetics that may benefit this family.   Our contact number was provided.  They are welcome to call us at anytime with additional questions or concerns.   Morrisa Aldaba M. Rennie Plowman, [name], [facility] Genetic Counselor [email or web address] (P) [phone]

## 2023-07-16 ENCOUNTER — Other Ambulatory Visit: Payer: 59

## 2023-07-24 ENCOUNTER — Inpatient Hospital Stay: Payer: 59 | Attending: Oncology | Admitting: Oncology

## 2023-07-27 ENCOUNTER — Ambulatory Visit: Payer: 59 | Admitting: Urology

## 2023-07-27 VITALS — BP 137/80 | HR 73

## 2023-07-27 DIAGNOSIS — N3946 Mixed incontinence: Secondary | ICD-10-CM

## 2023-07-27 DIAGNOSIS — R3129 Other microscopic hematuria: Secondary | ICD-10-CM | POA: Diagnosis not present

## 2023-07-27 DIAGNOSIS — R102 Pelvic and perineal pain: Secondary | ICD-10-CM | POA: Diagnosis not present

## 2023-07-27 MED ORDER — GEMTESA 75 MG PO TABS: 75.0000 mg | ORAL_TABLET | Freq: Every day | ORAL | 3 refills | Status: DC

## 2023-07-27 NOTE — Progress Notes (Signed)
 07/27/2023 1:03 PM   [name] [birth year] [id number]  Referring provider: Clovia Cuff, PA-C [address] [place],  [place] [address]  No chief complaint on file.   HPI: viewed previous note.  Patient dramatically better on Gemtesa.  Continence excellent.  Frequency stable.  No infections.  She will be spending 1 month in [place] with her daughter who lives there   Today Frequency stable.  Gemtesa helping urgency incontinence.  Infection free on trimethoprim.  She is going to [place] again for a wedding of her daughter.  Clinically not infected.    Today The co-pay for her once a day antibiotic was $100 so she stopped it 2 months ago and has been infection free.  We decided to see how she does before restarting them.  Urgency incontinence much better on the Gemtesa.  Clinically not infected.  Frequency stable  Today patient had microscopic hematuria but clinically was not infected   PMH: Past Medical History:  Diagnosis Date   Arthritis    At high risk for breast cancer 07/02/2023   Complication of anesthesia    slow to wake   Endometriosis    GERD (gastroesophageal reflux disease)    Hypertension    Lichen sclerosus    PONV (postoperative nausea and vomiting)    Sleep apnea     Surgical History: Past Surgical History:  Procedure Laterality Date   BREAST LUMPECTOMY WITH RADIOACTIVE SEED LOCALIZATION Right 12/04/2022   Procedure: RIGHT BREAST LUMPECTOMY WITH RADIOACTIVE SEED LOCALIZATION;  Surgeon: Abigail Miyamoto, MD;  Location: [facility];  Service: General;  Laterality: Right;   BREAST SURGERY Left    breast Bx/ fat necrosis   CESAREAN SECTION     CHOLECYSTECTOMY     DILATION AND CURETTAGE OF UTERUS     HYSTEROSCOPY     resection of polyps   LAPAROSCOPY     endometriosis   NOVASURE ABLATION  03/26/2009   REDUCTION MAMMAPLASTY Bilateral 07/28/08   Dr. [name] Contaginasis   RHINOPLASTY     VAGINAL DELIVERY     x2    Home  Medications:  Allergies as of 07/27/2023       Reactions   Sulfa Antibiotics Swelling   Lisinopril Other (See Comments)        Medication List        Accurate as of July 27, 2023  1:03 PM. If you have any questions, ask your nurse or doctor.          clobetasol ointment 0.05 % Commonly known as: TEMOVATE APPLY AS DIRECTED TWICE DAILY. ONCE ITCHING STOPPED, DECREASE TO NIGHTLY X 4 WEEK TOTAL   estradiol 0.1 MG/GM vaginal cream Commonly known as: ESTRACE 1 gram vaginally twice weekly   Gemtesa 75 MG Tabs Generic drug: Vibegron Take 1 tablet (75 mg total) by mouth daily.   losartan 50 MG tablet Commonly known as: COZAAR Take 50 mg by mouth daily.   omeprazole 40 MG capsule Commonly known as: PRILOSEC as needed.   traMADol 50 MG tablet Commonly known as: ULTRAM Take 1 tablet (50 mg total) by mouth every 6 (six) hours as needed for moderate pain or severe pain.   trimethoprim 100 MG tablet Commonly known as: TRIMPEX Take 1 tablet (100 mg total) by mouth daily.        Allergies:  Allergies  Allergen Reactions   Sulfa Antibiotics Swelling   Lisinopril Other (See Comments)    Family History: Family History  Problem Relation  Age of Onset   Bladder Cancer Mother        + kidney, dx 49   Hypertension Father    Colon cancer Father 1   Kidney disease Brother    Breast cancer Maternal Aunt        dx 78s   Rheum arthritis Paternal Grandmother    Breast cancer Cousin        dx 28s    Social History:  reports that she has never smoked. She has never been exposed to tobacco smoke. She has never used smokeless tobacco. She reports current alcohol use. She reports that she does not use drugs.  ROS:                                        Physical Exam: There were no vitals taken for this visit.  Constitutional:  Alert and oriented, No acute distress. HEENT: [place] AT, moist mucus membranes.  Trachea midline, no  masses.   Laboratory Data: Lab Results  Component Value Date   WBC 8.3 07/19/2018   HGB 13.1 07/19/2018   HCT 39.9 07/19/2018   MCV 83.5 07/19/2018   PLT 216 07/19/2018    Lab Results  Component Value Date   CREATININE 0.78 07/19/2018    No results found for: "PSA"  No results found for: "TESTOSTERONE"  No results found for: "HGBA1C"  Urinalysis    Component Value Date/Time   COLORURINE YELLOW 12/27/2021 1042   APPEARANCEUR Clear 07/28/2022 1508   LABSPEC 1.015 12/27/2021 1042   PHURINE 5.5 12/27/2021 1042   GLUCOSEU Negative 07/28/2022 1508   HGBUR NEGATIVE 12/27/2021 1042   BILIRUBINUR Negative 07/28/2022 1508   KETONESUR NEGATIVE 12/27/2021 1042   PROTEINUR Negative 07/28/2022 1508   PROTEINUR NEGATIVE 12/27/2021 1042   UROBILINOGEN negative (A) 07/02/2018 0932   UROBILINOGEN 0.2 11/15/2011 0751   NITRITE Negative 07/28/2022 1508   NITRITE NEGATIVE 12/27/2021 1042   LEUKOCYTESUR Trace (A) 07/28/2022 1508   LEUKOCYTESUR TRACE (A) 12/27/2021 1042    Pertinent Imaging: Urine reviewed and sent for culture.  Chart reviewed  Assessment & Plan: Patient is doing well with overactive bladder.  Gemtesa 90 x 3 sent to pharmacy.  At this stage were to treat infections as needed and reevaluate prophylaxis if she starts getting frequent infections.  Workup for microscopic hematuria discussed.  She will have a CT scan and come back for pelvic examination and cystoscopy.  I suspect the workup will be within normal limits  1. Mixed incontinence (Primary)  - Urinalysis, Complete  2. Pelvic pain    No follow-ups on file.  Martina Sinner, MD  [facility] [address], [address] [place], [place] [address] [phone]

## 2023-07-28 LAB — URINALYSIS, COMPLETE
Bilirubin, UA: NEGATIVE
Glucose, UA: NEGATIVE
Ketones, UA: NEGATIVE
Nitrite, UA: NEGATIVE
Protein,UA: NEGATIVE
Specific Gravity, UA: 1.025 (ref 1.005–1.030)
Urobilinogen, Ur: 0.2 mg/dL (ref 0.2–1.0)
pH, UA: 5 (ref 5.0–7.5)

## 2023-07-28 LAB — MICROSCOPIC EXAMINATION: Epithelial Cells (non renal): >10 /[HPF] — AB (ref 0–10)

## 2023-07-30 LAB — CULTURE, URINE COMPREHENSIVE

## 2023-07-31 ENCOUNTER — Other Ambulatory Visit (HOSPITAL_COMMUNITY)
Admission: RE | Admit: 2023-07-31 | Discharge: 2023-07-31 | Disposition: A | Discharge status: Home / Self Care | Payer: 59 | Source: Ambulatory Visit | Attending: Obstetrics & Gynecology | Admitting: Obstetrics & Gynecology

## 2023-07-31 ENCOUNTER — Ambulatory Visit (HOSPITAL_BASED_OUTPATIENT_CLINIC_OR_DEPARTMENT_OTHER): Payer: 59 | Admitting: Obstetrics & Gynecology

## 2023-07-31 ENCOUNTER — Encounter (HOSPITAL_BASED_OUTPATIENT_CLINIC_OR_DEPARTMENT_OTHER): Payer: Self-pay | Admitting: Obstetrics & Gynecology

## 2023-07-31 VITALS — BP 134/92 | HR 62 | Ht 66.0 in | Wt 228.8 lb

## 2023-07-31 DIAGNOSIS — N898 Other specified noninflammatory disorders of vagina: Secondary | ICD-10-CM | POA: Diagnosis present

## 2023-07-31 DIAGNOSIS — L900 Lichen sclerosus et atrophicus: Secondary | ICD-10-CM

## 2023-07-31 DIAGNOSIS — Z9189 Other specified personal risk factors, not elsewhere classified: Secondary | ICD-10-CM | POA: Diagnosis not present

## 2023-07-31 DIAGNOSIS — R140 Abdominal distension (gaseous): Secondary | ICD-10-CM | POA: Diagnosis not present

## 2023-07-31 MED ORDER — CLOBETASOL PROPIONATE 0.05 % EX OINT: TOPICAL_OINTMENT | CUTANEOUS | 0 refills | Status: AC

## 2023-07-31 NOTE — Progress Notes (Signed)
 GYNECOLOGY  VISIT  CC:   follow up after lichen sclerosus  HPI: 63 y.o. [id number] Married White or [name] here for recheck after being diagnosed with lichen sclerosus on visual inspection only.  She reports her symptoms did resolve.  But she was treated with abx with a root canal.  She is having some issues with mild itching right now.    She saw Dr. Sherron Monday recently.  Insurance is not covering her antibiotic so she stopped and she is going to see how she does off suppressive abx therapy.  .   Past Medical History:  Diagnosis Date   Arthritis    At high risk for breast cancer 07/02/2023   Complication of anesthesia    slow to wake   Endometriosis    GERD (gastroesophageal reflux disease)    Hypertension    Lichen sclerosus    PONV (postoperative nausea and vomiting)    Sleep apnea     MEDS:   Current Outpatient Medications on File Prior to Visit  Medication Sig Dispense Refill   clobetasol ointment (TEMOVATE) 0.05 % APPLY AS DIRECTED TWICE DAILY. ONCE ITCHING STOPPED, DECREASE TO NIGHTLY X 4 WEEK TOTAL 60 g 0   estradiol (ESTRACE) 0.1 MG/GM vaginal cream 1 gram vaginally twice weekly 42.5 g 1   losartan (COZAAR) 50 MG tablet Take 50 mg by mouth daily.     omeprazole (PRILOSEC) 40 MG capsule as needed.     traMADol (ULTRAM) 50 MG tablet Take 1 tablet (50 mg total) by mouth every 6 (six) hours as needed for moderate pain or severe pain. 20 tablet 0   Vibegron (GEMTESA) 75 MG TABS Take 1 tablet (75 mg total) by mouth daily. 90 tablet 3   No current facility-administered medications on file prior to visit.    ALLERGIES: Sulfa antibiotics and Lisinopril  SH:  married, non smoking  Review of Systems  Constitutional: Negative.   Genitourinary:        Vaginal itching    PHYSICAL EXAMINATION:    BP (!) 134/92 (BP Location: Left Arm, Patient Position: Sitting, Cuff Size: Large)   Pulse 62   Ht 5\' 6"  (1.676 m)   Wt 228 lb 12.8 oz (103.8 kg)   BMI 36.93 kg/m      General appearance: alert, cooperative and appears stated age Lymph:  no inguinal LAD noted  Pelvic: External genitalia:  no lesions, not hypopigmentation prsent              Urethra:  normal appearing urethra with no masses, tenderness or lesions              Bartholins and Skenes: normal                 Vagina:  normal mucosa, watery discharge present              Anus:  no lesions  Assessment/Plan: 1. Lichen sclerosus (Primary) - doing well.  Has stopped the topical steroid.  RF will be given for future as needed. - clobetasol ointment (TEMOVATE) 0.05 %; Apply small amount topically to itching skin twice daily for up to 5 days as needed.  Dispense: 60 g; Refill: 0  2. At high risk for breast cancer - planning MRI every 6 months  3. Bloating - will return for [place] this summer - US PELVIS TRANSVAGINAL NON-OB (TV ONLY); Future  4. Vaginal itching - Cervicovaginal ancillary only

## 2023-08-03 LAB — CERVICOVAGINAL ANCILLARY ONLY
Bacterial Vaginitis (gardnerella): NEGATIVE
Candida Glabrata: NEGATIVE
Candida Vaginitis: NEGATIVE
Comment: NEGATIVE
Comment: NEGATIVE
Comment: NEGATIVE

## 2023-08-31 ENCOUNTER — Encounter: Payer: 59 | Admitting: Urology

## 2023-09-14 ENCOUNTER — Other Ambulatory Visit: Payer: 59 | Admitting: Urology

## 2023-09-23 IMAGING — US US PELVIS COMPLETE WITH TRANSVAGINAL
1 series · 14 of 25 positions shown · non-contrast
Comparison: 08/18/2019

CLINICAL DATA: Postmenopausal bleeding, endometrial ablation 12
years ago, RIGHT lower quadrant pain, past history endometriosis



[Series 1: us pelvic complete with transvaginal · 96 acquisitions, 14 frames shown]
[im 1/96]
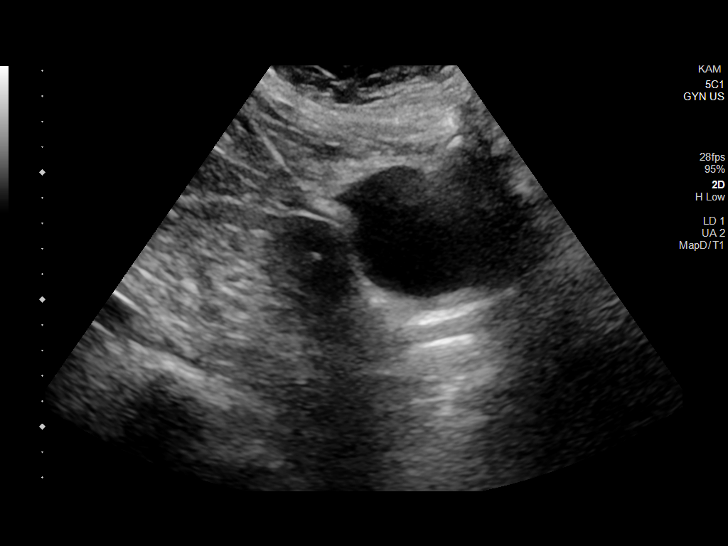
[im 8/96]
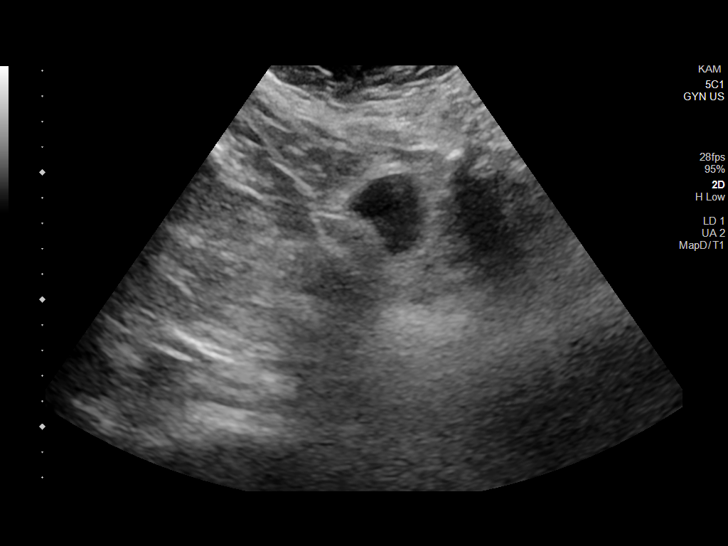
[im 16/96]
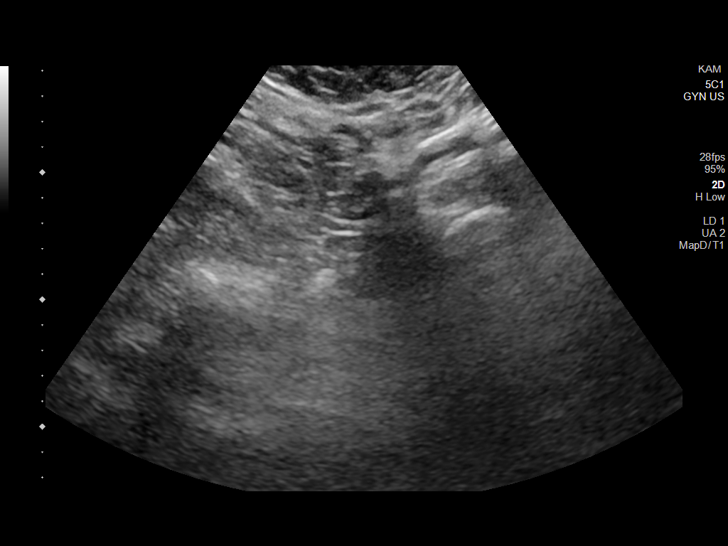
[im 24/96]
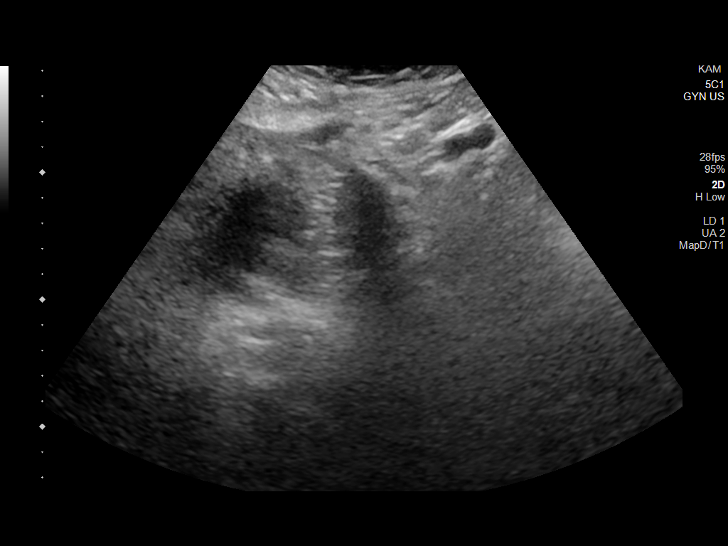
[im 32/96]
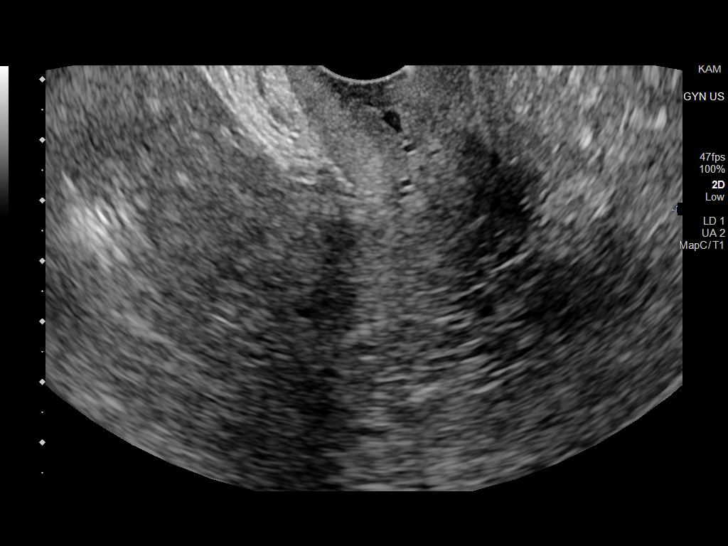
[im 36/96]
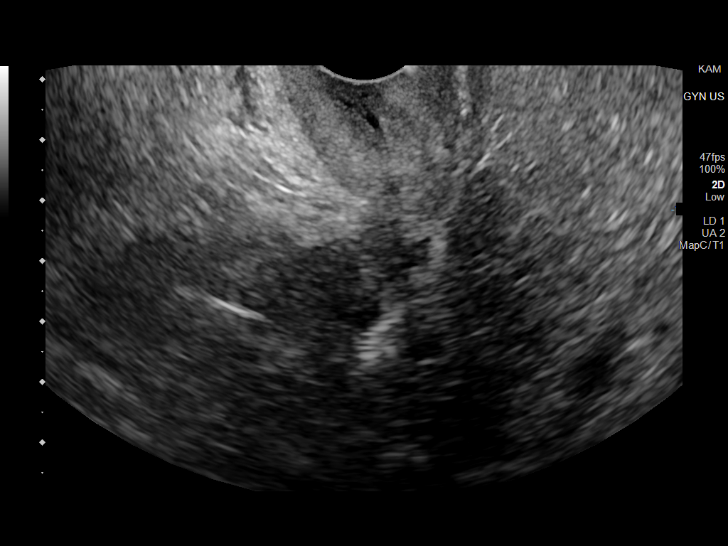
[im 44/96]
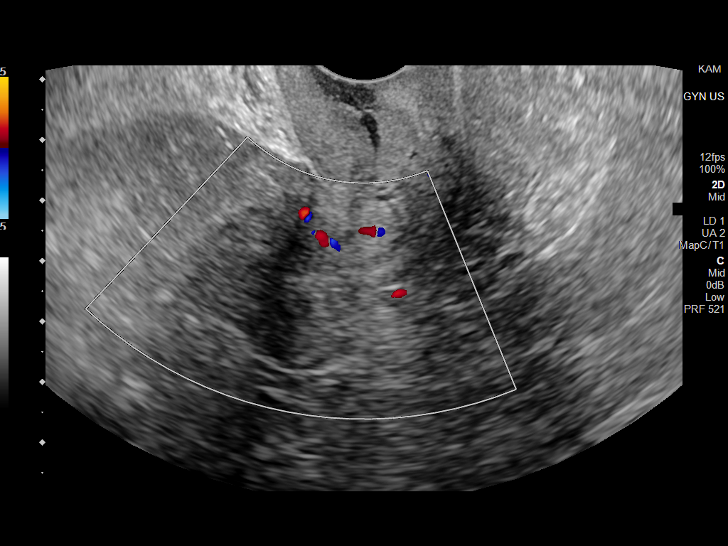
[im 52/96]
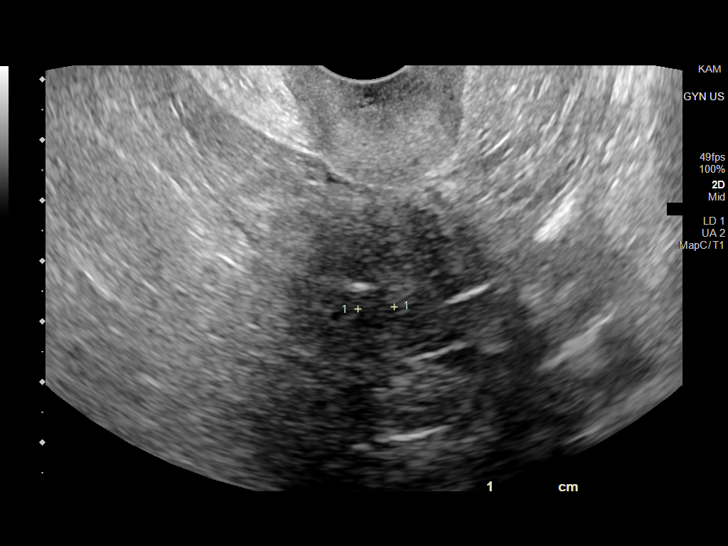
[im 60/96]
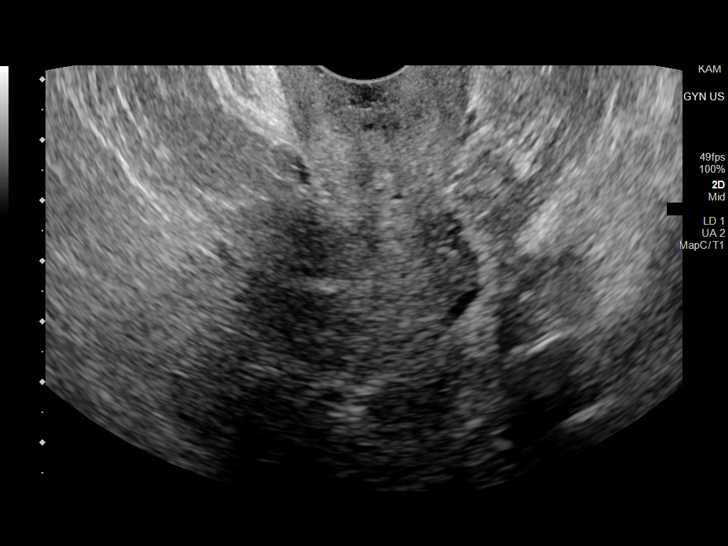
[im 64/96]
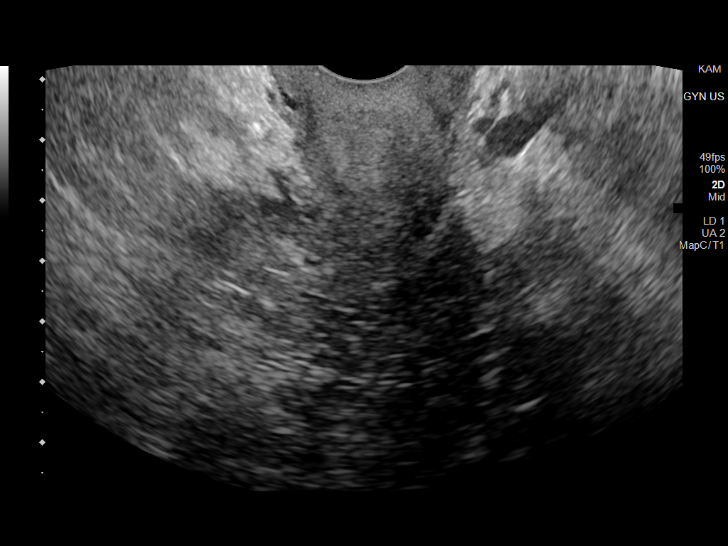
[im 72/96]
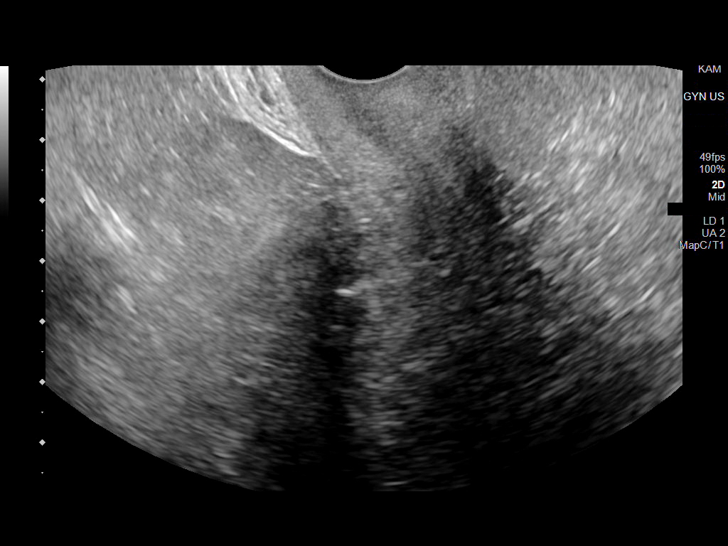
[im 80/96]
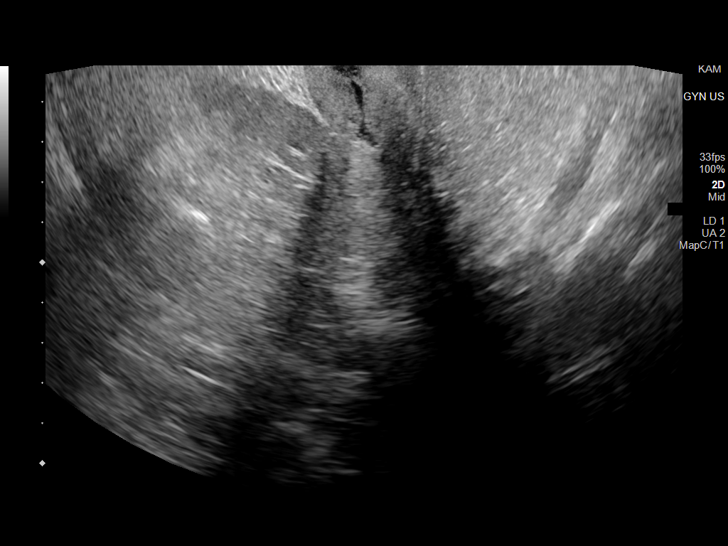
[im 88/96]
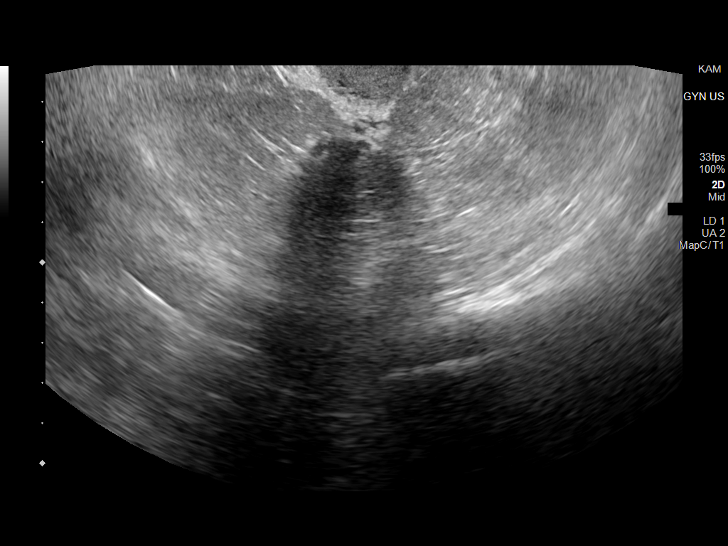
[im 96/96]
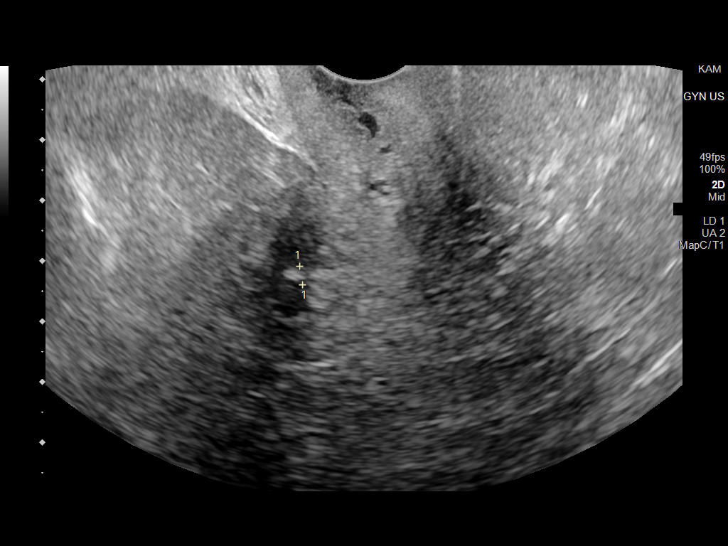

[14 of 25 positions shown; findings below may reference images not displayed]

FINDINGS: Uterus

Measurements: 6.8 x 3.4 x 3.9 cm = volume: 47 mL. Anteverted.
Heterogeneous myometrium. No definite focal mass.

Endometrium

Thickness: 3 mm.  No endometrial fluid or mass

Right ovary

Measurements: 3.3 x 1.8 x 2.6 cm = volume: 8.2 mL. Suboptimally
visualized due to shadowing from bowel. No gross mass.

Left ovary

Not visualized, likely obscured by bowel

Other findings

No free pelvic fluid or adnexal masses.
IMPRESSION: Nonvisualization of LEFT ovary.

No definite pelvic sonographic abnormalities.

## 2023-09-28 ENCOUNTER — Encounter: Payer: Self-pay | Admitting: Urology

## 2023-09-28 ENCOUNTER — Ambulatory Visit: Payer: 59 | Admitting: Urology

## 2023-09-28 VITALS — BP 125/86 | HR 68 | Ht 66.0 in | Wt 218.0 lb

## 2023-09-28 DIAGNOSIS — N3946 Mixed incontinence: Secondary | ICD-10-CM

## 2023-09-28 DIAGNOSIS — R102 Pelvic and perineal pain: Secondary | ICD-10-CM | POA: Diagnosis not present

## 2023-09-28 DIAGNOSIS — N390 Urinary tract infection, site not specified: Secondary | ICD-10-CM

## 2023-09-28 DIAGNOSIS — R3129 Other microscopic hematuria: Secondary | ICD-10-CM

## 2023-09-28 LAB — URINALYSIS, COMPLETE
Bilirubin, UA: NEGATIVE
Glucose, UA: NEGATIVE
Ketones, UA: NEGATIVE
Nitrite, UA: NEGATIVE
Protein,UA: NEGATIVE
Specific Gravity, UA: 1.02 (ref 1.005–1.030)
Urobilinogen, Ur: 0.2 mg/dL (ref 0.2–1.0)
pH, UA: 5.5 (ref 5.0–7.5)

## 2023-09-28 LAB — MICROSCOPIC EXAMINATION

## 2023-09-28 MED ORDER — OXYBUTYNIN CHLORIDE ER 10 MG PO TB24: 10.0000 mg | ORAL_TABLET | Freq: Every day | ORAL | 11 refills | Status: DC

## 2023-09-28 NOTE — Patient Instructions (Signed)
 Stop Gemtesa , start Oxybutinin  Call Central Scheduling for the CT scan [phone]

## 2023-09-28 NOTE — Progress Notes (Addendum)
 09/28/2023 9:20 AM   [name] 10-23-1960 [id number]  Referring provider: Bertrand Brod, PA-C [address] [place],  [place] [address]  Chief Complaint  Patient presents with   Cysto    HPI: reviewed previous note.  Patient dramatically better on Gemtesa .  Continence excellent.  Frequency stable.  No infections.  She will be spending 1 month in [place] with her daughter who lives there   Today Frequency stable.  Gemtesa  helping urgency incontinence.  Infection free on trimethoprim .  She is going to [place] again for a wedding of her daughter.  Clinically not infected.     Today The co-pay for her once a day antibiotic was $100 so she stopped it 2 months ago and has been infection free.  We decided to see how she does before restarting them.  Urgency incontinence much better on the Gemtesa .  Clinically not infected.  Frequency stable   Today patient had microscopic hematuria but clinically was not infected   patient is doing well with overactive bladder. Gemtesa  90 x 3 sent to pharmacy. At this stage were to treat infections as needed and reevaluate prophylaxis if she starts getting frequent infections. Workup for microscopic hematuria discussed. She will have a CT scan and come back for pelvic examination and cystoscopy. I suspect the workup will be within normal limits    Today Frequency stable.  Last culture negative.  CT scan ordered but not performed. When I reviewed my last notes I noted she had mixed incontinence and 70-80% of the problem was an overactive bladder.  She has mild stress incontinence.  She still wears 2 pads a day.  I felt that her leakage without awareness was due to overactivity.  I have recommended physical therapy.  When I saw her in August 2022 she was dramatically better on Gemtesa .  She has failed [name]  and [name] .  She had an impressive overactive bladder at low bladder volumes reaching pressure of 58 cm of water.  Bladder capacity was  250 mL.  No stress incontinence with Valsalva pressure of 79 cm water.  Cystoscopy: Patient underwent flexible cystoscopy.  Bladder mucosa and trigone were normal.  No cystitis.  No carcinoma.  She has some increased vascularity in her bladder within normal limits.  No urethral diverticulum  She grade 2 hypermobility the bladder neck.  She has suburethral elevation I do not think she has a diverticulum.  No stress incontinence with light cough after cystoscopy  When the patient stood up she leaked a lot.    PMH: Past Medical History:  Diagnosis Date   Arthritis    At high risk for breast cancer 07/02/2023   Complication of anesthesia    slow to wake   Endometriosis    GERD (gastroesophageal reflux disease)    Hypertension    Lichen sclerosus    PONV (postoperative nausea and vomiting)    Sleep apnea     Surgical History: Past Surgical History:  Procedure Laterality Date   BREAST LUMPECTOMY WITH RADIOACTIVE SEED LOCALIZATION Right 12/04/2022   Procedure: RIGHT BREAST LUMPECTOMY WITH RADIOACTIVE SEED LOCALIZATION;  Surgeon: Oza Blumenthal, MD;  Location: [facility];  Service: General;  Laterality: Right;   BREAST SURGERY Left    breast Bx/ fat necrosis   CESAREAN SECTION     CHOLECYSTECTOMY     DILATION AND CURETTAGE OF UTERUS     HYSTEROSCOPY     resection of polyps   LAPAROSCOPY     endometriosis  NOVASURE ABLATION  03/26/2009   REDUCTION MAMMAPLASTY Bilateral 07/28/08   Dr. Kevon Pellegrini Contaginasis   RHINOPLASTY     VAGINAL DELIVERY     x2    Home Medications:  Allergies as of 09/28/2023       Reactions   Sulfa Antibiotics Swelling   Lisinopril Other (See Comments)        Medication List        Accurate as of September 28, 2023  9:20 AM. If you have any questions, ask your nurse or doctor.          STOP taking these medications    traMADol  50 MG tablet Commonly known as: ULTRAM  Stopped by: Geralyn Knee A Keontay Vora       TAKE these  medications    clobetasol  ointment 0.05 % Commonly known as: TEMOVATE  Apply small amount topically to itching skin twice daily for up to 5 days as needed.   Gemtesa  75 MG Tabs Generic drug: Vibegron  Take 1 tablet (75 mg total) by mouth daily.   losartan 50 MG tablet Commonly known as: COZAAR Take 50 mg by mouth daily.   omeprazole 40 MG capsule Commonly known as: PRILOSEC as needed.        Allergies:  Allergies  Allergen Reactions   Sulfa Antibiotics Swelling   Lisinopril Other (See Comments)    Family History: Family History  Problem Relation Age of Onset   Bladder Cancer Mother        + kidney, dx 100   Hypertension Father    Colon cancer Father 17   Kidney disease Brother    Breast cancer Maternal Aunt        dx 54s   Rheum arthritis Paternal Grandmother    Breast cancer Cousin        dx 88s    Social History:  reports that she has never smoked. She has never been exposed to tobacco smoke. She has never used smokeless tobacco. She reports current alcohol use. She reports that she does not use drugs.  ROS:                                        Physical Exam: BP 125/86   Pulse 68   Ht 5\' 6"  (1.676 m)   Wt 98.9 kg   BMI 35.19 kg/m   Constitutional:  Alert and oriented, No acute distress. HEENT: [place] AT, moist mucus membranes.  Trachea midline, no masses.   Laboratory Data: Lab Results  Component Value Date   WBC 8.3 07/19/2018   HGB 13.1 07/19/2018   HCT 39.9 07/19/2018   MCV 83.5 07/19/2018   PLT 216 07/19/2018    Lab Results  Component Value Date   CREATININE 0.78 07/19/2018    No results found for: "PSA"  No results found for: "TESTOSTERONE"  No results found for: "HGBA1C"  Urinalysis    Component Value Date/Time   COLORURINE YELLOW 12/27/2021 1042   APPEARANCEUR Clear 07/27/2023 1259   LABSPEC 1.015 12/27/2021 1042   PHURINE 5.5 12/27/2021 1042   GLUCOSEU Negative 07/27/2023 1259   HGBUR NEGATIVE  12/27/2021 1042   BILIRUBINUR Negative 07/27/2023 1259   KETONESUR NEGATIVE 12/27/2021 1042   PROTEINUR Negative 07/27/2023 1259   PROTEINUR NEGATIVE 12/27/2021 1042   UROBILINOGEN negative (A) 07/02/2018 0932   UROBILINOGEN 0.2 11/15/2011 0751   NITRITE Negative 07/27/2023 1259   NITRITE NEGATIVE 12/27/2021 1042  LEUKOCYTESUR Trace (A) 07/27/2023 1259   LEUKOCYTESUR TRACE (A) 12/27/2021 1042    Pertinent Imaging: Urine reviewed and sent for culture  Assessment & Plan: Patient has mixed incontinence but primarily overactive bladder.  She will try oxybutynin  ER 10 mg 3 x 11.  I will repeat the history again next visit.  We will talk about refractory treatments.  Call if culture positive.  I want to get the CT scan.  Stop the Gemtesa .  Patient is going to see me in [place] as well  1. Mixed incontinence (Primary)  - Urinalysis, Complete  2. Pelvic pain  - Urinalysis, Complete  3. Recurrent UTI  - Urinalysis, Complete  4. Microscopic hematuria  - Urinalysis, Complete   No follow-ups on file.  Devorah Fonder, MD  [facility] [address], [address] [place], [place] [address] [phone]

## 2023-10-01 LAB — CULTURE, URINE COMPREHENSIVE

## 2023-10-02 ENCOUNTER — Encounter: Payer: Self-pay | Admitting: Urology

## 2023-10-06 ENCOUNTER — Other Ambulatory Visit: Payer: Self-pay

## 2023-10-06 DIAGNOSIS — N390 Urinary tract infection, site not specified: Secondary | ICD-10-CM

## 2023-10-06 MED ORDER — NITROFURANTOIN MONOHYD MACRO 100 MG PO CAPS: 100.0000 mg | ORAL_CAPSULE | Freq: Two times a day (BID) | ORAL | 0 refills | Status: AC

## 2023-10-09 ENCOUNTER — Other Ambulatory Visit

## 2023-10-09 ENCOUNTER — Ambulatory Visit
Admission: RE | Admit: 2023-10-09 | Discharge: 2023-10-09 | Disposition: A | Discharge status: Home / Self Care | Source: Ambulatory Visit | Attending: Urology | Admitting: Urology

## 2023-10-09 DIAGNOSIS — R3129 Other microscopic hematuria: Secondary | ICD-10-CM | POA: Diagnosis present

## 2023-10-09 DIAGNOSIS — N3946 Mixed incontinence: Secondary | ICD-10-CM | POA: Insufficient documentation

## 2023-10-09 MED ORDER — IOHEXOL 300 MG/ML  SOLN
125.0000 mL | Freq: Once | INTRAMUSCULAR | Status: AC | PRN
  Administered 2023-10-09: 125 mL via INTRAVENOUS

## 2023-10-09 MED ORDER — SODIUM CHLORIDE 0.9 % IV SOLN: INTRAVENOUS | Status: DC

## 2023-10-21 ENCOUNTER — Encounter: Payer: Self-pay | Admitting: *Deleted

## 2023-10-21 ENCOUNTER — Ambulatory Visit
Admission: EM | Admit: 2023-10-21 | Discharge: 2023-10-21 | Disposition: A | Discharge status: Home / Self Care | Attending: Emergency Medicine | Admitting: Emergency Medicine

## 2023-10-21 DIAGNOSIS — R300 Dysuria: Secondary | ICD-10-CM | POA: Diagnosis not present

## 2023-10-21 LAB — POCT URINALYSIS DIP (MANUAL ENTRY)
Glucose, UA: NEGATIVE mg/dL
Ketones, POC UA: NEGATIVE mg/dL
Nitrite, UA: NEGATIVE
Spec Grav, UA: >=1.03 — AB
Urobilinogen, UA: 0.2 U/dL
pH, UA: 5.5

## 2023-10-21 MED ORDER — CIPROFLOXACIN HCL 500 MG PO TABS: 500.0000 mg | ORAL_TABLET | Freq: Two times a day (BID) | ORAL | 0 refills | Status: AC

## 2023-10-21 NOTE — ED Triage Notes (Signed)
 Patient states she had a urology procedure about 3 weeks ago, had UTI after and completed Macrobid  with return of symptoms.  Endorses dysuria, heaviness, fatigue

## 2023-10-21 NOTE — Telephone Encounter (Signed)
 Patient dropped in office at 4:34 pm and wanted to leave urine sample due to uti symptoms she has been having. Per previous phone note on 10/19/23, patient had been advised to call office to schedule appt since it had been about 3 weeks since she was seen. She stated she was going out of town tomorrow and needed antibiotic. She did not want to schedule an appointment, and stated she would go to ER or Urgent Care.

## 2023-10-21 NOTE — ED Provider Notes (Signed)
 [name]    CSN: [id number] Arrival date & time: 10/21/23  1654      History   Chief Complaint Chief Complaint  Patient presents with   Dysuria    HPI [name] is a 63 y.o. female.   Patient presents for evaluation of dysuria, urinary frequency, lower abdominal pressure and lower back pain beginning 3 days ago.  History of reoccurring UTI followed by urology and a recent surgical procedure.  Denies fever, hematuria or vaginal symptoms.  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/2023   Family history of cancer 05/15/2023   Chronic idiopathic constipation 08/31/2020   Abdominal bloating 08/31/2020   Diverticulosis of colon 08/31/2020   Dysphagia 08/31/2020   Epigastric pain 08/31/2020   Rectal bleeding 08/31/2020   Psoriasis 12/18/2017   Essential hypertension 10/09/2017   Overactive bladder 06/25/2017   Elevated glucose 05/09/2016   Obesity (BMI 30-39.9) 05/09/2016   Primary osteoarthritis of right knee 03/06/2016   Back pain 05/02/2012    Past Surgical History:  Procedure Laterality Date   BREAST LUMPECTOMY WITH RADIOACTIVE SEED LOCALIZATION Right 12/04/2022   Procedure: RIGHT BREAST LUMPECTOMY WITH RADIOACTIVE SEED LOCALIZATION;  Surgeon: Oza Blumenthal, MD;  Location: [facility];  Service: General;  Laterality: Right;   BREAST SURGERY Left    breast Bx/ fat necrosis   CESAREAN SECTION     CHOLECYSTECTOMY     DILATION AND CURETTAGE OF UTERUS     HYSTEROSCOPY     resection of polyps   LAPAROSCOPY      endometriosis   NOVASURE ABLATION  03/26/2009   REDUCTION MAMMAPLASTY Bilateral 07/28/08   Dr. Kevon Pellegrini Contaginasis   RHINOPLASTY     VAGINAL DELIVERY     x2    OB History     Gravida  6   Para  3   Term  3   Preterm  0   AB  3   Living  3      SAB  3   IAB  0   Ectopic  0   Multiple  0   Live Births  3            Home Medications    Prior to Admission medications   Medication Sig Start Date End Date Taking? Authorizing Provider  ciprofloxacin (CIPRO) 500 MG tablet Take 1 tablet (500 mg total) by mouth 2 (two) times daily for 3 days. 10/21/23 10/24/23 Yes Eartha Vonbehren, Maybelle Spatz, NP  clobetasol  ointment (TEMOVATE ) 0.05 % Apply small amount topically to itching skin twice daily for up to 5 days as needed. 07/31/23   Lillian Rein, MD  losartan (COZAAR) 50 MG tablet Take 50 mg by mouth daily. 01/25/21   [provider]  omeprazole (PRILOSEC) 40 MG capsule as needed. 06/04/18   [provider]  oxybutynin  (DITROPAN -XL) 10 MG 24 hr tablet Take 1 tablet (10 mg total) by mouth daily. 09/28/23   Erman Hayward, MD  Family History Family History  Problem Relation Age of Onset   Bladder Cancer Mother        + kidney, dx 37   Hypertension Father    Colon cancer Father 65   Kidney disease Brother    Breast cancer Maternal Aunt        dx 109s   Rheum arthritis Paternal Grandmother    Breast cancer Cousin        dx 12s    Social History Social History   Tobacco Use   Smoking status: Never    Passive exposure: Never   Smokeless tobacco: Never  Vaping Use   Vaping status: Never Used  Substance Use Topics   Alcohol use: Yes    Comment: occ   Drug use: No     Allergies   Sulfa antibiotics and Lisinopril   Review of Systems Review of Systems   Physical Exam Triage Vital Signs ED Triage Vitals  Encounter Vitals Group     BP 10/21/23 1748 115/76     Systolic BP Percentile --      Diastolic BP Percentile --      Pulse Rate  10/21/23 1748 70     Resp 10/21/23 1748 18     Temp 10/21/23 1748 97.9 F (36.6 C)     Temp Source 10/21/23 1748 Oral     SpO2 10/21/23 1748 94 %     Weight 10/21/23 1746 208 lb (94.3 kg)     Height 10/21/23 1746 5\' 7"  (1.702 m)     Head Circumference --      Peak Flow --      Pain Score 10/21/23 1746 6     Pain Loc --      Pain Education --      Exclude from Growth Chart --    No data found.  Updated Vital Signs BP 115/76 (BP Location: Left Arm)   Pulse 70   Temp 97.9 F (36.6 C) (Oral)   Resp 18   Ht 5\' 7"  (1.702 m)   Wt 208 lb (94.3 kg)   SpO2 94%   BMI 32.58 kg/m   Visual Acuity Right Eye Distance:   Left Eye Distance:   Bilateral Distance:    Right Eye Near:   Left Eye Near:    Bilateral Near:     Physical Exam Constitutional:      Appearance: Normal appearance.  Eyes:     Extraocular Movements: Extraocular movements intact.  Pulmonary:     Effort: Pulmonary effort is normal.  Abdominal:     General: Abdomen is flat. Bowel sounds are normal.     Palpations: Abdomen is soft.     Tenderness: There is abdominal tenderness in the suprapubic area. There is no right CVA tenderness or left CVA tenderness.  Neurological:     Mental Status: She is alert and oriented to person, place, and time. Mental status is at baseline.      UC Treatments / Results  Labs (all labs ordered are listed, but only abnormal results are displayed) Labs Reviewed  POCT URINALYSIS DIP (MANUAL ENTRY) - Abnormal; Notable for the following components:      Result Value   Bilirubin, UA small (*)    Spec Grav, UA >=1.030 (*)    Blood, UA trace-intact (*)    Protein Ur, POC trace (*)    Leukocytes, UA Small (1+) (*)    All other components within normal limits    EKG  Radiology No results found.  Procedures Procedures (including critical care time)  Medications Ordered in UC Medications - No data to display  Initial Impression / Assessment and Plan / UC Course  I have  reviewed the triage vital signs and the nursing notes.  Pertinent labs & imaging results that were available during my care of the patient were reviewed by me and considered in my medical decision making (see chart for details).  Dysuria  Urinalysis showing leukocytes, negative for nitrates, discussed findings with patient, sent for culture, prescribed ciprofloxacin, has had cyst within the past per patient, recommended supportive care with follow-up as needed Final Clinical Impressions(s) / UC Diagnoses   Final diagnoses:  Dysuria   Discharge Instructions      Your urinalysis shows blood cells but does not show nitrates which is the enzyme related to bacteria your urine will be sent to the lab to determine exactly which bacteria is present, if any changes need to be made to your medications you will be notified  Begin use of Cipro twice daily for 3 days, chosen based off your last culture results which showed E. coli  You may use over-the-counter Azo to help minimize your symptoms until antibiotic removes bacteria, this medication will turn your urine orange  Increase your fluid intake through use of water  As always practice good hygiene, wiping front to back and avoidance of scented vaginal products to prevent further irritation  If symptoms continue to persist after use of medication or recur please follow-up with urgent care or your primary doctor as needed   ED Prescriptions     Medication Sig Dispense Auth. Provider   ciprofloxacin (CIPRO) 500 MG tablet Take 1 tablet (500 mg total) by mouth 2 (two) times daily for 3 days. 6 tablet Zyire Eidson R, NP      PDMP not reviewed this encounter.   Reena Canning, NP 10/21/23 [phone]

## 2023-10-21 NOTE — Discharge Instructions (Signed)
 Your urinalysis shows blood cells but does not show nitrates which is the enzyme related to bacteria your urine will be sent to the lab to determine exactly which bacteria is present, if any changes need to be made to your medications you will be notified  Begin use of Cipro twice daily for 3 days, chosen based off your last culture results which showed E. coli  You may use over-the-counter Azo to help minimize your symptoms until antibiotic removes bacteria, this medication will turn your urine orange  Increase your fluid intake through use of water  As always practice good hygiene, wiping front to back and avoidance of scented vaginal products to prevent further irritation  If symptoms continue to persist after use of medication or recur please follow-up with urgent care or your primary doctor as needed

## 2023-10-23 ENCOUNTER — Ambulatory Visit (HOSPITAL_COMMUNITY): Payer: Self-pay

## 2023-10-23 LAB — URINE CULTURE: Culture: >=100000 — AB

## 2023-11-25 ENCOUNTER — Ambulatory Visit (HOSPITAL_BASED_OUTPATIENT_CLINIC_OR_DEPARTMENT_OTHER): Payer: 59

## 2023-11-25 ENCOUNTER — Ambulatory Visit (HOSPITAL_BASED_OUTPATIENT_CLINIC_OR_DEPARTMENT_OTHER): Payer: 59 | Admitting: Obstetrics & Gynecology

## 2023-11-25 ENCOUNTER — Encounter (HOSPITAL_BASED_OUTPATIENT_CLINIC_OR_DEPARTMENT_OTHER): Payer: Self-pay | Admitting: Obstetrics & Gynecology

## 2023-11-25 VITALS — BP 116/78 | HR 71 | Ht 66.0 in | Wt 218.6 lb

## 2023-11-25 DIAGNOSIS — R140 Abdominal distension (gaseous): Secondary | ICD-10-CM

## 2023-11-25 DIAGNOSIS — Z9189 Other specified personal risk factors, not elsewhere classified: Secondary | ICD-10-CM | POA: Diagnosis not present

## 2023-11-25 DIAGNOSIS — N6091 Unspecified benign mammary dysplasia of right breast: Secondary | ICD-10-CM

## 2023-11-25 NOTE — Progress Notes (Signed)
 GYNECOLOGY  VISIT  CC:   Discuss ultrasound results  HPI: 63 y.o. [id number] Married White or [name] here for discussion of ultrasound results.  Ultrasound obtained due to abdominal bloating and concerns regarding ovaries.  Uterus measures 5.2 x 4.0 x 3.0cm.  Endometrium 2.7mm.  Ovaries not well seen but no adnexal cysts or masses noted.  No pelvic free fluid noted.    Unrelated, pt has hx of Pt saw Dr. Babara 04/2023.  Anti-estrogen therapy discussed.  For now, she has declined.  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  Has also seen Dr. Gaston and did a CT. This showed mild sigmoid diverticulosis and small left renal stones.  She has also seen Dr. Kristie, GI, due to some bowel changes.  Colonoscopy was normal 04/2021.     Past Medical History:  Diagnosis Date   Arthritis    At high risk for breast cancer 07/02/2023   Complication of anesthesia    slow to wake   Endometriosis    GERD (gastroesophageal reflux disease)    Hypertension    Lichen sclerosus    PONV (postoperative nausea and vomiting)    Sleep apnea     MEDS:   Current Outpatient Medications on File Prior to Visit  Medication Sig Dispense Refill   clobetasol  ointment (TEMOVATE ) 0.05 % Apply small amount topically to itching skin twice daily for up to 5 days as needed. 60 g 0   losartan (COZAAR) 50 MG tablet Take 50 mg by mouth daily.     omeprazole (PRILOSEC) 40 MG capsule as needed.     oxybutynin  (DITROPAN -XL) 10 MG 24 hr tablet Take 1 tablet (10 mg total) by mouth daily. 30 tablet 11   No current facility-administered medications on file prior to visit.    ALLERGIES: Sulfa antibiotics and Lisinopril  SH:  married, non smoker  Review of Systems  Constitutional: Negative.   Genitourinary: Negative.     PHYSICAL EXAMINATION:    BP 116/78   Pulse 71   Ht 5' 6 (1.676 m)   Wt 218 lb 9.6 oz (99.2 kg)   BMI 35.28 kg/m       Physical Exam Constitutional:      Appearance: Normal appearance.   Neurological:     General: No focal deficit present.     Mental Status: She is alert.   Psychiatric:        Mood and Affect: Mood normal.    Assessment/Plan: 1. Abdominal bloating (Primary) - ultrasound reassuring  2. Increased risk of breast cancer - will proceed with scheduling breat MRI for pt.  MMG was in January so will order now.   - MR BREAST BILATERAL W WO CONTRAST INC CAD; Future  3. Atypical ductal hyperplasia of right breast

## 2023-12-07 ENCOUNTER — Ambulatory Visit: Admitting: Urology

## 2023-12-14 ENCOUNTER — Ambulatory Visit: Admitting: Urology

## 2024-01-08 ENCOUNTER — Ambulatory Visit
Admission: RE | Admit: 2024-01-08 | Discharge: 2024-01-08 | Disposition: A | Discharge status: Home / Self Care | Source: Ambulatory Visit | Attending: Emergency Medicine | Admitting: Emergency Medicine

## 2024-01-08 VITALS — BP 127/83 | HR 61 | Temp 97.8°F | Resp 18

## 2024-01-08 DIAGNOSIS — N3000 Acute cystitis without hematuria: Secondary | ICD-10-CM | POA: Diagnosis present

## 2024-01-08 DIAGNOSIS — R350 Frequency of micturition: Secondary | ICD-10-CM | POA: Insufficient documentation

## 2024-01-08 LAB — POCT URINE DIPSTICK
Bilirubin, UA: NEGATIVE
Glucose, UA: 100 mg/dL — AB
Ketones, POC UA: NEGATIVE mg/dL
Nitrite, UA: POSITIVE — AB
POC PROTEIN,UA: 30 — AB
Spec Grav, UA: 1.01 (ref 1.010–1.025)
Urobilinogen, UA: 2 U/dL — AB
pH, UA: 5 (ref 5.0–8.0)

## 2024-01-08 MED ORDER — CIPROFLOXACIN HCL 500 MG PO TABS: 500.0000 mg | ORAL_TABLET | Freq: Two times a day (BID) | ORAL | 0 refills | Status: AC

## 2024-01-08 NOTE — Discharge Instructions (Addendum)
 Your urinalysis shows [name] blood cells and nitrates which are indicative of infection, your urine will be sent to the lab to determine exactly which bacteria is present, if any changes need to be made to your medications you will be notified  Begin use of Ciprofloxacin  twice daily for 5 days  You may use over-the-counter Azo to help minimize your symptoms until antibiotic removes bacteria, this medication will turn your urine orange  Increase your fluid intake through use of water  As always practice good hygiene, wiping front to back and avoidance of scented vaginal products to prevent further irritation  If symptoms continue to persist after use of medication or recur please follow-up with urgent care or your primary doctor as needed

## 2024-01-08 NOTE — ED Provider Notes (Signed)
 CAY RALPH PELT    CSN: [id number] Arrival date & time: 01/08/24  1545      History   Chief Complaint Chief Complaint  Patient presents with   Urinary Frequency    UTI - Entered by patient   Dysuria    HPI NIVEDITA MIRABELLA is a 63 y.o. female.   Patient presents for evaluation of urinary frequency and dysuria beginning 2 weeks ago.  Associated intermittent lower abdominal pain and intermittent lower back pain.  Endorses that she increase her fluid intake and use Azo and symptoms resolved before clearing 2 days ago.  Rates pain a 4 out of 10.  Unsure of presence of hematuria due to use of Azo, denies fever, chills, vaginal symptoms.  History of reoccurring UTI  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/2023   Family history of cancer 05/15/2023   Chronic idiopathic constipation 08/31/2020   Abdominal bloating 08/31/2020   Diverticulosis of colon 08/31/2020   Dysphagia 08/31/2020   Epigastric pain 08/31/2020   Rectal bleeding 08/31/2020   Psoriasis 12/18/2017   Essential hypertension 10/09/2017   Overactive bladder 06/25/2017   Elevated glucose 05/09/2016   Obesity (BMI 30-39.9) 05/09/2016   Primary osteoarthritis of right knee 03/06/2016   Back pain 05/02/2012    Past Surgical History:  Procedure Laterality Date   BREAST LUMPECTOMY WITH RADIOACTIVE SEED LOCALIZATION Right 12/04/2022   Procedure: RIGHT BREAST LUMPECTOMY WITH RADIOACTIVE SEED LOCALIZATION;  Surgeon: Vernetta Berg, MD;  Location: [facility];  Service:  General;  Laterality: Right;   BREAST SURGERY Left    breast Bx/ fat necrosis   CESAREAN SECTION     CHOLECYSTECTOMY     DILATION AND CURETTAGE OF UTERUS     HYSTEROSCOPY     resection of polyps   LAPAROSCOPY     endometriosis   NOVASURE ABLATION  03/26/2009   REDUCTION MAMMAPLASTY Bilateral 07/28/08   Dr. Ronal Caldron Contaginasis   RHINOPLASTY     VAGINAL DELIVERY     x2    OB History     Gravida  6   Para  3   Term  3   Preterm  0   AB  3   Living  3      SAB  3   IAB  0   Ectopic  0   Multiple  0   Live Births  3            Home Medications    Prior to Admission medications   Medication Sig Start Date End Date Taking? Authorizing Provider  ciprofloxacin  (CIPRO ) 500 MG tablet Take 1 tablet (500 mg total) by mouth 2 (two) times daily for 5 days. 01/08/24 01/13/24 Yes Frisco Cordts R, NP  GEMTESA  75 MG TABS Take 1 tablet by mouth daily. 10/24/23  Yes [provider]  clobetasol  ointment (TEMOVATE ) 0.05 % Apply small amount topically to itching skin twice daily for up to 5 days as needed.  07/31/23   Cleotilde Ronal RAMAN, MD  losartan (COZAAR) 50 MG tablet Take 50 mg by mouth daily. 01/25/21   [provider]  omeprazole (PRILOSEC) 40 MG capsule as needed. 06/04/18   [provider]  oxybutynin  (DITROPAN -XL) 10 MG 24 hr tablet Take 1 tablet (10 mg total) by mouth daily. 09/28/23   Gaston Hamilton, MD    Family History Family History  Problem Relation Age of Onset   Bladder Cancer Mother        + kidney, dx 27   Hypertension Father    Colon cancer Father 20   Kidney disease Brother    Breast cancer Maternal Aunt        dx 54s   Rheum arthritis Paternal Grandmother    Breast cancer Cousin        dx 25s    Social History Social History   Tobacco Use   Smoking status: Never    Passive exposure: Never   Smokeless tobacco: Never  Vaping Use   Vaping status: Never Used  Substance Use Topics   Alcohol use: Yes    Comment: occ    Drug use: No     Allergies   Sulfa antibiotics and Lisinopril   Review of Systems Review of Systems   Physical Exam Triage Vital Signs ED Triage Vitals  Encounter Vitals Group     BP 01/08/24 1602 127/83     Girls Systolic BP Percentile --      Girls Diastolic BP Percentile --      Boys Systolic BP Percentile --      Boys Diastolic BP Percentile --      Pulse Rate 01/08/24 1602 61     Resp 01/08/24 1602 18     Temp 01/08/24 1602 97.8 F (36.6 C)     Temp Source 01/08/24 1602 Oral     SpO2 01/08/24 1602 97 %     Weight --      Height --      Head Circumference --      Peak Flow --      Pain Score 01/08/24 1559 4     Pain Loc --      Pain Education --      Exclude from Growth Chart --    No data found.  Updated Vital Signs BP 127/83 (BP Location: Left Arm)   Pulse 61   Temp 97.8 F (36.6 C) (Oral)   Resp 18   SpO2 97%   Visual Acuity Right Eye Distance:   Left Eye Distance:   Bilateral Distance:    Right Eye Near:   Left Eye Near:    Bilateral Near:     Physical Exam Constitutional:      Appearance: Normal appearance.  Eyes:     Extraocular Movements: Extraocular movements intact.  Pulmonary:     Effort: Pulmonary effort is normal.  Abdominal:     Tenderness: There is no abdominal tenderness. There is no right CVA tenderness, left CVA tenderness or guarding.  Neurological:     Mental Status: She is alert and oriented to person, place, and time. Mental status is at baseline.      UC Treatments / Results  Labs (all labs ordered are listed, but only abnormal results are displayed) Labs Reviewed  POCT URINE DIPSTICK - Abnormal; Notable for the following components:      Result Value   Color, UA orange (*)    Glucose, UA =100 (*)  Blood, UA trace-intact (*)    POC PROTEIN,UA =30 (*)    Urobilinogen, UA 2.0 (*)    Nitrite, UA Positive (*)    Leukocytes, UA Large (3+) (*)    All other components within normal limits  URINE CULTURE     EKG   Radiology No results found.  Procedures Procedures (including critical care time)  Medications Ordered in UC Medications - No data to display  Initial Impression / Assessment and Plan / UC Course  I have reviewed the triage vital signs and the nursing notes.  Pertinent labs & imaging results that were available during my care of the patient were reviewed by me and considered in my medical decision making (see chart for details).  Acute cystitis without hematuria, urinary frequency  Urinalysis showing leukocytes and nitrates, sent for culture, last culture in July 2025 showing Klebsiella pneumoniae antibiotic chosen based on this, will reinitiate Cipro  and extend from 3 to 5 days, discussed administration with patient, recommend over-the-counter medication and nonpharmacological supportive care and advised follow-up with urgent care or the primary doctor as needed Final Clinical Impressions(s) / UC Diagnoses   Final diagnoses:  Urinary frequency  Acute cystitis without hematuria     Discharge Instructions      Your urinalysis shows Amelio Brosky blood cells and nitrates which are indicative of infection, your urine will be sent to the lab to determine exactly which bacteria is present, if any changes need to be made to your medications you will be notified  Begin use of Ciprofloxacin  twice daily for 5 days  You may use over-the-counter Azo to help minimize your symptoms until antibiotic removes bacteria, this medication will turn your urine orange  Increase your fluid intake through use of water  As always practice good hygiene, wiping front to back and avoidance of scented vaginal products to prevent further irritation  If symptoms continue to persist after use of medication or recur please follow-up with urgent care or your primary doctor as needed    ED Prescriptions     Medication Sig Dispense Auth. Provider   ciprofloxacin  (CIPRO ) 500 MG tablet Take 1 tablet  (500 mg total) by mouth 2 (two) times daily for 5 days. 10 tablet Consandra Laske R, NP      PDMP not reviewed this encounter.   Teresa Shelba SAUNDERS, NP 01/08/24 1620

## 2024-01-08 NOTE — ED Triage Notes (Signed)
 Patient reports urinary frequency, and painful urination that started 2 weeks ago. Used AZO and dranked lots of water helped with symptoms. Symptoms returned 2 days ago. Patent reports having UTI previous and was on 2 different antibiotics. Rates discomfort 4/10. Patient took AZO earlier today and helped with symptoms.

## 2024-01-09 LAB — URINE CULTURE: Culture: <10000 — AB

## 2024-01-11 ENCOUNTER — Ambulatory Visit (HOSPITAL_COMMUNITY): Payer: Self-pay

## 2024-01-27 ENCOUNTER — Encounter (HOSPITAL_BASED_OUTPATIENT_CLINIC_OR_DEPARTMENT_OTHER): Payer: Self-pay | Admitting: Obstetrics & Gynecology

## 2024-01-29 ENCOUNTER — Ambulatory Visit
Admission: RE | Admit: 2024-01-29 | Discharge: 2024-01-29 | Disposition: A | Discharge status: Home / Self Care | Source: Ambulatory Visit | Attending: Obstetrics & Gynecology | Admitting: Obstetrics & Gynecology

## 2024-01-29 DIAGNOSIS — N6091 Unspecified benign mammary dysplasia of right breast: Secondary | ICD-10-CM

## 2024-01-29 DIAGNOSIS — Z9189 Other specified personal risk factors, not elsewhere classified: Secondary | ICD-10-CM

## 2024-01-29 MED ORDER — GADOPICLENOL 0.5 MMOL/ML IV SOLN
10.0000 mL | Freq: Once | INTRAVENOUS | Status: AC | PRN
  Administered 2024-01-29: 10 mL via INTRAVENOUS

## 2024-02-02 ENCOUNTER — Ambulatory Visit (HOSPITAL_BASED_OUTPATIENT_CLINIC_OR_DEPARTMENT_OTHER): Payer: Self-pay | Admitting: Obstetrics & Gynecology

## 2024-03-14 ENCOUNTER — Encounter (HOSPITAL_BASED_OUTPATIENT_CLINIC_OR_DEPARTMENT_OTHER): Payer: Self-pay | Admitting: Obstetrics & Gynecology

## 2024-03-15 ENCOUNTER — Encounter (HOSPITAL_BASED_OUTPATIENT_CLINIC_OR_DEPARTMENT_OTHER): Payer: Self-pay

## 2024-04-08 ENCOUNTER — Telehealth (HOSPITAL_BASED_OUTPATIENT_CLINIC_OR_DEPARTMENT_OTHER): Payer: Self-pay

## 2024-04-08 NOTE — Telephone Encounter (Signed)
 Patient left voicemail on nurse line returning calls regarding MRI result. RN returned call to patient, left message requesting patient call office.   Stephanie LOISE Quale, RN

## 2024-05-26 NOTE — Progress Notes (Unsigned)
° °  ANNUAL EXAM Patient name: [name] MRN [id number]  Date of birth: 09/14/1960 Chief Complaint:   No chief complaint on file.  History of Present Illness:   [name] is a 63 y.o. [phone] [name] being seen today for a routine annual exam.  Current complaints: ***  No LMP recorded. Patient has had an ablation.   The pregnancy intention screening data noted above was reviewed. Potential methods of contraception were discussed. The patient elected to proceed with No data recorded.   Last pap 05/01/2023. Results were: NILM w/ HRHPV not done. H/O abnormal pap: {yes/yes***/no:23866} Last mammogram: MRI on 01/29/2024. Results were: Probably benign small amount of focal non mass enhancement at the right breast excisional site favored to represent fat necrosis. Family h/o breast cancer: {yes***/no:23838} Last colonoscopy: 09/30/2021. Results were: normal. Family h/o colorectal cancer: {yes***/no:23838}     11/25/2023    2:36 PM 07/31/2023    9:11 AM 05/01/2023   10:45 AM 09/27/2021    9:06 AM  Depression screen PHQ 2/9  Decreased Interest 0 0 0 0  Down, Depressed, Hopeless 0 0 0 0  PHQ - 2 Score 0 0 0 0         No data to display           Review of Systems:   Pertinent items are noted in HPI Denies any headaches, blurred vision, fatigue, shortness of breath, chest pain, abdominal pain, abnormal vaginal discharge/itching/odor/irritation, problems with periods, bowel movements, urination, or intercourse unless otherwise stated above. Pertinent History Reviewed:  Reviewed past medical,surgical, social and family history.  Reviewed problem list, medications and allergies. Physical Assessment:  There were no vitals filed for this visit.There is no height or weight on file to calculate BMI.        Physical Examination:   General appearance - well appearing, and in no distress  Mental status - alert, oriented to person, place, and time  Psych:  She has a  normal mood and affect  Skin - warm and dry, normal color, no suspicious lesions noted  Chest - effort normal, all lung fields clear to auscultation bilaterally  Heart - normal rate and regular rhythm  Neck:  midline trachea, no thyromegaly or nodules  Breasts - breasts appear normal, no suspicious masses, no skin or nipple changes or  axillary nodes  Abdomen - soft, nontender, nondistended, no masses or organomegaly  Pelvic - VULVA: normal appearing vulva with no masses, tenderness or lesions  VAGINA: normal appearing vagina with normal color and discharge, no lesions  CERVIX: normal appearing cervix without discharge or lesions, no CMT  Thin prep pap is {Desc; done/not:10129} *** HR HPV cotesting  UTERUS: uterus is felt to be normal size, shape, consistency and nontender   ADNEXA: No adnexal masses or tenderness noted.  Rectal - normal rectal, good sphincter tone, no masses felt. Hemoccult: ***  Extremities:  No swelling or varicosities noted  Chaperone present for exam  No results found for this or any previous visit (from the past 24 hours).  Assessment & Plan:  1) Well-Woman Exam  2) ***  Labs/procedures today: ***  Mammogram: {Mammo f/u:25212::@ 63yo}, or sooner if problems Colonoscopy: {TCS f/u:25213::@ 63yo}, or sooner if problems  No orders of the defined types were placed in this encounter.   Meds: No orders of the defined types were placed in this encounter.   Follow-up: No follow-ups on file.  Morna LOISE Quale, RN 05/26/2024 12:47 PM

## 2024-05-27 ENCOUNTER — Ambulatory Visit (INDEPENDENT_AMBULATORY_CARE_PROVIDER_SITE_OTHER): Payer: 59 | Admitting: Obstetrics & Gynecology

## 2024-05-27 ENCOUNTER — Encounter (HOSPITAL_BASED_OUTPATIENT_CLINIC_OR_DEPARTMENT_OTHER): Payer: Self-pay | Admitting: Obstetrics & Gynecology

## 2024-05-27 VITALS — BP 122/83 | HR 62 | Ht 66.0 in | Wt 204.0 lb

## 2024-05-27 DIAGNOSIS — Z01419 Encounter for gynecological examination (general) (routine) without abnormal findings: Secondary | ICD-10-CM

## 2024-05-27 DIAGNOSIS — N952 Postmenopausal atrophic vaginitis: Secondary | ICD-10-CM | POA: Diagnosis not present

## 2024-05-27 DIAGNOSIS — Z9189 Other specified personal risk factors, not elsewhere classified: Secondary | ICD-10-CM

## 2024-05-27 DIAGNOSIS — N6099 Unspecified benign mammary dysplasia of unspecified breast: Secondary | ICD-10-CM | POA: Diagnosis not present

## 2024-05-27 DIAGNOSIS — Z1331 Encounter for screening for depression: Secondary | ICD-10-CM

## 2024-05-27 DIAGNOSIS — N390 Urinary tract infection, site not specified: Secondary | ICD-10-CM | POA: Diagnosis not present

## 2024-05-27 DIAGNOSIS — L900 Lichen sclerosus et atrophicus: Secondary | ICD-10-CM | POA: Diagnosis not present

## 2024-07-01 ENCOUNTER — Encounter (HOSPITAL_BASED_OUTPATIENT_CLINIC_OR_DEPARTMENT_OTHER): Payer: Self-pay | Admitting: Obstetrics & Gynecology
